# Patient Record
Sex: Male | Born: 1970
Health system: Southern US, Community
[De-identification: ages and names within clinical notes are randomized; demographics above are authoritative.]

## PROBLEM LIST (undated history)

## (undated) DIAGNOSIS — E785 Hyperlipidemia, unspecified: Secondary | ICD-10-CM

## (undated) DIAGNOSIS — E119 Type 2 diabetes mellitus without complications: Secondary | ICD-10-CM

## (undated) DIAGNOSIS — I10 Essential (primary) hypertension: Secondary | ICD-10-CM

## (undated) HISTORY — DX: Hyperlipidemia, unspecified: E78.5

## (undated) HISTORY — DX: Essential (primary) hypertension: I10

## (undated) HISTORY — DX: Type 2 diabetes mellitus without complications: E11.9

## (undated) HISTORY — PX: TONSILECTOMY/ADENOIDECTOMY WITH MYRINGOTOMY: SHX6125

---
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
## 1998-04-25 ENCOUNTER — Emergency Department (HOSPITAL_COMMUNITY): Admission: EM | Admit: 1998-04-25 | Discharge: 1998-04-25 | Payer: Self-pay | Admitting: Emergency Medicine

## 1998-06-23 ENCOUNTER — Emergency Department (HOSPITAL_COMMUNITY): Admission: EM | Admit: 1998-06-23 | Discharge: 1998-06-23 | Payer: Self-pay | Admitting: Emergency Medicine

## 2001-05-08 ENCOUNTER — Emergency Department (HOSPITAL_COMMUNITY): Admission: EM | Admit: 2001-05-08 | Discharge: 2001-05-08 | Payer: Self-pay | Admitting: Emergency Medicine

## 2001-06-08 ENCOUNTER — Emergency Department (HOSPITAL_COMMUNITY): Admission: EM | Admit: 2001-06-08 | Discharge: 2001-06-08 | Payer: Self-pay | Admitting: Emergency Medicine

## 2014-09-23 ENCOUNTER — Ambulatory Visit: Payer: Self-pay | Admitting: Internal Medicine

## 2014-09-23 ENCOUNTER — Encounter: Payer: Self-pay | Admitting: *Deleted

## 2014-11-26 ENCOUNTER — Emergency Department (INDEPENDENT_AMBULATORY_CARE_PROVIDER_SITE_OTHER)
Admission: EM | Admit: 2014-11-26 | Discharge: 2014-11-26 | Disposition: A | Discharge status: Home / Self Care | Payer: 59 | Source: Home / Self Care | Attending: Family Medicine | Admitting: Family Medicine

## 2014-11-26 ENCOUNTER — Encounter (HOSPITAL_COMMUNITY): Payer: Self-pay | Admitting: Emergency Medicine

## 2014-11-26 DIAGNOSIS — R69 Illness, unspecified: Secondary | ICD-10-CM

## 2014-11-26 DIAGNOSIS — J111 Influenza due to unidentified influenza virus with other respiratory manifestations: Secondary | ICD-10-CM

## 2014-11-26 DIAGNOSIS — A084 Viral intestinal infection, unspecified: Secondary | ICD-10-CM

## 2014-11-26 MED ORDER — PROMETHAZINE HCL 25 MG PO TABS: 25.0000 mg | ORAL_TABLET | Freq: Four times a day (QID) | ORAL | Status: DC | PRN

## 2014-11-26 NOTE — ED Provider Notes (Signed)
Howard Calhoun is a 44 y.o. male who presents to Urgent Care today for 4 days of body aches fever or headache abdominal pain nausea vomiting and diarrhea. This is also associated with decreased appetite. The abdominal pain resolved yesterday. Patient has mostly diarrhea and nausea with occasional vomiting now. He notes the headaches and body aches are improving. He has tried ibuprofen and NyQuil and DayQuil which seemed to help some. He denies any chest pains or significant difficulty with breathing. He feels well otherwise.   Past Medical History  Diagnosis Date  . Diabetes mellitus without complication   . Hypertension   . Hyperlipidemia    Past Surgical History  Procedure Laterality Date  . Appendectomy  1992  . Arthroscopic repair acl  1990  . Tonsilectomy/adenoidectomy with myringotomy     History  Substance Use Topics  . Smoking status: Never Smoker   . Smokeless tobacco: Not on file  . Alcohol Use: Yes     Comment: 1-2 a month   ROS as above Medications: No current facility-administered medications for this encounter.   Current Outpatient Prescriptions  Medication Sig Dispense Refill  . METFORMIN HCL ER PO Take 500 mg by mouth daily.    . Methylsulfonylmethane (MSM) 750 MG CAPS Take 2-3 capsules by mouth daily.    . promethazine (PHENERGAN) 25 MG tablet Take 1 tablet (25 mg total) by mouth every 6 (six) hours as needed for nausea or vomiting. 20 tablet 0   No Known Allergies   Exam:  BP 120/81 mmHg  Pulse 79  Temp(Src) 98.8 F (37.1 C) (Oral)  Resp 16  SpO2 97% Gen: Well NAD HEENT: EOMI,  MMM normal posterior pharynx and tympanic membranes Lungs: Normal work of breathing. CTABL Heart: RRR no MRG Abd: NABS, Soft. Nondistended, Nontender Exts: Brisk capillary refill, warm and well perfused.   No results found for this or any previous visit (from the past 24 hour(s)). No results found.  Assessment and Plan: 44 y.o. male with viral illness likely influenza  versus gastroenteritis. Patient is outside of treatment window for Tamiflu and afebrile. Treat with Phenergan for vomiting and continue Tylenol or NSAIDs for headache. Recommended over-the-counter Imodium for diarrhea.  Discussed warning signs or symptoms. Please see discharge instructions. Patient expresses understanding.     Howard BongEvan S Betzabe Bevans, MD 11/26/14 1723

## 2014-11-26 NOTE — Discharge Instructions (Signed)
Thank you for coming in today. Take Tylenol or ibuprofen or Aleve for pain and fevers and chills. Use Phenergan for nausea. Use over-the-counter Imodium as needed for diarrhea Call or go to the emergency room if you get worse, have trouble breathing, have chest pains, or palpitations.  If your belly pain worsens, or you have high fever, bad vomiting, blood in your stool or black tarry stool go to the Emergency Room.   Influenza Influenza ("the flu") is a viral infection of the respiratory tract. It occurs more often in winter months because people spend more time in close contact with one another. Influenza can make you feel very sick. Influenza easily spreads from person to person (contagious). CAUSES  Influenza is caused by a virus that infects the respiratory tract. You can catch the virus by breathing in droplets from an infected person's cough or sneeze. You can also catch the virus by touching something that was recently contaminated with the virus and then touching your mouth, nose, or eyes. RISKS AND COMPLICATIONS You may be at risk for a more severe case of influenza if you smoke cigarettes, have diabetes, have chronic heart disease (such as heart failure) or lung disease (such as asthma), or if you have a weakened immune system. Elderly people and pregnant women are also at risk for more serious infections. The most common problem of influenza is a lung infection (pneumonia). Sometimes, this problem can require emergency medical care and may be life threatening. SIGNS AND SYMPTOMS  Symptoms typically last 4 to 10 days and may include:  Fever.  Chills.  Headache, body aches, and muscle aches.  Sore throat.  Chest discomfort and cough.  Poor appetite.  Weakness or feeling tired.  Dizziness.  Nausea or vomiting. DIAGNOSIS  Diagnosis of influenza is often made based on your history and a physical exam. A nose or throat swab test can be done to confirm the diagnosis. TREATMENT   In mild cases, influenza goes away on its own. Treatment is directed at relieving symptoms. For more severe cases, your health care provider may prescribe antiviral medicines to shorten the sickness. Antibiotic medicines are not effective because the infection is caused by a virus, not by bacteria. HOME CARE INSTRUCTIONS  Take medicines only as directed by your health care provider.  Use a cool mist humidifier to make breathing easier.  Get plenty of rest until your temperature returns to normal. This usually takes 3 to 4 days.  Drink enough fluid to keep your urine clear or pale yellow.  Cover yourmouth and nosewhen coughing or sneezing,and wash your handswellto prevent thevirusfrom spreading.  Stay homefromwork orschool untilthe fever is gonefor at least 48full day. PREVENTION  An annual influenza vaccination (flu shot) is the best way to avoid getting influenza. An annual flu shot is now routinely recommended for all adults in the [place] SEEK MEDICAL CARE IF:  You experiencechest pain, yourcough worsens,or you producemore mucus.  Youhave nausea,vomiting, ordiarrhea.  Your fever returns or gets worse. SEEK IMMEDIATE MEDICAL CARE IF:  You havetrouble breathing, you become short of breath,or your skin ornails becomebluish.  You have severe painor stiffnessin the neck.  You develop a sudden headache, or pain in the face or ear.  You have nausea or vomiting that you cannot control. MAKE SURE YOU:   Understand these instructions.  Will watch your condition.  Will get help right away if you are not doing well or get worse. Document Released: 08/30/2000 Document Revised: 01/17/2014 Document Reviewed: 12/02/2011  ExitCare Patient Information 2015 [place], [place]. This information is not intended to replace advice given to you by your health care provider. Make sure you discuss any questions you have with your health care provider.   Viral  Gastroenteritis Viral gastroenteritis is also known as stomach flu. This condition affects the stomach and intestinal tract. It can cause sudden diarrhea and vomiting. The illness typically lasts 3 to 8 days. Most people develop an immune response that eventually gets rid of the virus. While this natural response develops, the virus can make you quite ill. CAUSES  Many different viruses can cause gastroenteritis, such as rotavirus or noroviruses. You can catch one of these viruses by consuming contaminated food or water. You may also catch a virus by sharing utensils or other personal items with an infected person or by touching a contaminated surface. SYMPTOMS  The most common symptoms are diarrhea and vomiting. These problems can cause a severe loss of body fluids (dehydration) and a body salt (electrolyte) imbalance. Other symptoms may include:  Fever.  Headache.  Fatigue.  Abdominal pain. DIAGNOSIS  Your caregiver can usually diagnose viral gastroenteritis based on your symptoms and a physical exam. A stool sample may also be taken to test for the presence of viruses or other infections. TREATMENT  This illness typically goes away on its own. Treatments are aimed at rehydration. The most serious cases of viral gastroenteritis involve vomiting so severely that you are not able to keep fluids down. In these cases, fluids must be given through an intravenous line (IV). HOME CARE INSTRUCTIONS   Drink enough fluids to keep your urine clear or pale yellow. Drink small amounts of fluids frequently and increase the amounts as tolerated.  Ask your caregiver for specific rehydration instructions.  Avoid:  Foods high in sugar.  Alcohol.  Carbonated drinks.  Tobacco.  Juice.  Caffeine drinks.  Extremely hot or cold fluids.  Fatty, greasy foods.  Too much intake of anything at one time.  Dairy products until 24 to 48 hours after diarrhea stops.  You may consume probiotics.  Probiotics are active cultures of beneficial bacteria. They may lessen the amount and number of diarrheal stools in adults. Probiotics can be found in yogurt with active cultures and in supplements.  Wash your hands well to avoid spreading the virus.  Only take over-the-counter or prescription medicines for pain, discomfort, or fever as directed by your caregiver. Do not give aspirin to children. Antidiarrheal medicines are not recommended.  Ask your caregiver if you should continue to take your regular prescribed and over-the-counter medicines.  Keep all follow-up appointments as directed by your caregiver. SEEK IMMEDIATE MEDICAL CARE IF:   You are unable to keep fluids down.  You do not urinate at least once every 6 to 8 hours.  You develop shortness of breath.  You notice blood in your stool or vomit. This may look like coffee grounds.  You have abdominal pain that increases or is concentrated in one small area (localized).  You have persistent vomiting or diarrhea.  You have a fever.  The patient is a child younger than 3 months, and he or she has a fever.  The patient is a child older than 3 months, and he or she has a fever and persistent symptoms.  The patient is a child older than 3 months, and he or she has a fever and symptoms suddenly get worse.  The patient is a baby, and he or she has no tears when  crying. MAKE SURE YOU:   Understand these instructions.  Will watch your condition.  Will get help right away if you are not doing well or get worse. Document Released: 09/02/2005 Document Revised: 11/25/2011 Document Reviewed: 06/19/2011 [facility] Patient Information 2015 [place], [place]. This information is not intended to replace advice given to you by your health care provider. Make sure you discuss any questions you have with your health care provider.

## 2014-11-26 NOTE — ED Notes (Signed)
C/o cold sx onset Tuesday Sx include HA, f/n/d, abd pain, BA, decreased appetite Alert, no signs of acute distress.

## 2015-12-12 ENCOUNTER — Telehealth: Payer: Self-pay | Admitting: General Practice

## 2015-12-12 NOTE — Telephone Encounter (Signed)
appt is set.  °

## 2015-12-12 NOTE — Telephone Encounter (Signed)
Pt will call back to schedule

## 2015-12-12 NOTE — Telephone Encounter (Signed)
yes

## 2015-12-12 NOTE — Telephone Encounter (Signed)
Pt is in need of a PCP and you were recommended Can you take him on?

## 2015-12-21 ENCOUNTER — Ambulatory Visit (INDEPENDENT_AMBULATORY_CARE_PROVIDER_SITE_OTHER): Payer: 59 | Admitting: Internal Medicine

## 2015-12-21 ENCOUNTER — Other Ambulatory Visit (INDEPENDENT_AMBULATORY_CARE_PROVIDER_SITE_OTHER): Payer: 59

## 2015-12-21 ENCOUNTER — Encounter: Payer: Self-pay | Admitting: Internal Medicine

## 2015-12-21 VITALS — BP 138/88 | HR 77 | Temp 98.4°F | Resp 16 | Ht 70.0 in | Wt 244.0 lb

## 2015-12-21 DIAGNOSIS — Z0000 Encounter for general adult medical examination without abnormal findings: Secondary | ICD-10-CM | POA: Insufficient documentation

## 2015-12-21 DIAGNOSIS — I10 Essential (primary) hypertension: Secondary | ICD-10-CM

## 2015-12-21 DIAGNOSIS — Z23 Encounter for immunization: Secondary | ICD-10-CM | POA: Diagnosis not present

## 2015-12-21 DIAGNOSIS — E785 Hyperlipidemia, unspecified: Secondary | ICD-10-CM | POA: Diagnosis not present

## 2015-12-21 DIAGNOSIS — E118 Type 2 diabetes mellitus with unspecified complications: Secondary | ICD-10-CM | POA: Diagnosis not present

## 2015-12-21 DIAGNOSIS — K2100 Gastro-esophageal reflux disease with esophagitis, without bleeding: Secondary | ICD-10-CM

## 2015-12-21 LAB — URINALYSIS, ROUTINE W REFLEX MICROSCOPIC
Bilirubin Urine: NEGATIVE
Hgb urine dipstick: NEGATIVE
KETONES UR: NEGATIVE
Leukocytes, UA: NEGATIVE
Nitrite: NEGATIVE
PH: 5 (ref 5.0–8.0)
Total Protein, Urine: NEGATIVE
UROBILINOGEN UA: 0.2 (ref 0.0–1.0)
Urine Glucose: >=1000 — AB

## 2015-12-21 LAB — LIPID PANEL
CHOL/HDL RATIO: 5
CHOLESTEROL: 229 mg/dL — AB (ref 0–200)
HDL: 48 mg/dL (ref >39.00)
LDL CALC: 148 mg/dL — AB (ref 0–99)
NonHDL: 181.24
TRIGLYCERIDES: 167 mg/dL — AB (ref 0.0–149.0)
VLDL: 33.4 mg/dL (ref 0.0–40.0)

## 2015-12-21 LAB — COMPREHENSIVE METABOLIC PANEL
ALK PHOS: 57 U/L (ref 39–117)
ALT: 20 U/L (ref 0–53)
AST: 17 U/L (ref 0–37)
Albumin: 4.2 g/dL (ref 3.5–5.2)
BILIRUBIN TOTAL: 0.4 mg/dL (ref 0.2–1.2)
BUN: 12 mg/dL (ref 6–23)
CO2: 30 meq/L (ref 19–32)
Calcium: 9.5 mg/dL (ref 8.4–10.5)
Chloride: 102 mEq/L (ref 96–112)
Creatinine, Ser: 0.96 mg/dL (ref 0.40–1.50)
GFR: 110.05 mL/min (ref >60.00)
GLUCOSE: 315 mg/dL — AB (ref 70–99)
POTASSIUM: 4 meq/L (ref 3.5–5.1)
SODIUM: 139 meq/L (ref 135–145)
TOTAL PROTEIN: 7.3 g/dL (ref 6.0–8.3)

## 2015-12-21 LAB — CBC WITH DIFFERENTIAL/PLATELET
Basophils Absolute: 0 10*3/uL (ref 0.0–0.1)
Basophils Relative: 0.4 % (ref 0.0–3.0)
EOS PCT: 0.3 % (ref 0.0–5.0)
Eosinophils Absolute: 0 10*3/uL (ref 0.0–0.7)
HCT: 40.8 % (ref 39.0–52.0)
Hemoglobin: 13.6 g/dL (ref 13.0–17.0)
LYMPHS ABS: 2.5 10*3/uL (ref 0.7–4.0)
Lymphocytes Relative: 30.9 % (ref 12.0–46.0)
MCHC: 33.4 g/dL (ref 30.0–36.0)
MCV: 84.8 fl (ref 78.0–100.0)
MONO ABS: 0.4 10*3/uL (ref 0.1–1.0)
Monocytes Relative: 5 % (ref 3.0–12.0)
NEUTROS ABS: 5.1 10*3/uL (ref 1.4–7.7)
NEUTROS PCT: 63.4 % (ref 43.0–77.0)
PLATELETS: 347 10*3/uL (ref 150.0–400.0)
RBC: 4.81 Mil/uL (ref 4.22–5.81)
RDW: 13.6 % (ref 11.5–15.5)
WBC: 8 10*3/uL (ref 4.0–10.5)

## 2015-12-21 LAB — MICROALBUMIN / CREATININE URINE RATIO
CREATININE, U: 184.3 mg/dL
MICROALB UR: 2.6 mg/dL — AB (ref 0.0–1.9)
MICROALB/CREAT RATIO: 1.4 mg/g (ref 0.0–30.0)

## 2015-12-21 LAB — FECAL OCCULT BLOOD, GUAIAC: Fecal Occult Blood: NEGATIVE

## 2015-12-21 LAB — HEMOGLOBIN A1C: Hgb A1c MFr Bld: 13.2 % — ABNORMAL HIGH (ref 4.6–6.5)

## 2015-12-21 LAB — PSA: PSA: 0.4 ng/mL (ref 0.10–4.00)

## 2015-12-21 LAB — POCT GLUCOSE (DEVICE FOR HOME USE): POC Glucose: 326 mg/dl — AB (ref 70–99)

## 2015-12-21 MED ORDER — ONETOUCH VERIO FLEX SYSTEM W/DEVICE KIT: 1.0000 | PACK | Freq: Three times a day (TID) | Status: DC

## 2015-12-21 MED ORDER — INSULIN DEGLUDEC 100 UNIT/ML ~~LOC~~ SOPN: 40.0000 [IU] | PEN_INJECTOR | Freq: Every day | SUBCUTANEOUS | Status: DC

## 2015-12-21 MED ORDER — METFORMIN HCL ER (MOD) 1000 MG PO TB24: 1000.0000 mg | ORAL_TABLET | Freq: Two times a day (BID) | ORAL | Status: DC

## 2015-12-21 MED ORDER — LINAGLIPTIN 5 MG PO TABS: 5.0000 mg | ORAL_TABLET | Freq: Every day | ORAL | Status: DC

## 2015-12-21 MED ORDER — GLUCOSE BLOOD VI STRP: ORAL_STRIP | Status: DC

## 2015-12-21 NOTE — Progress Notes (Signed)
Pre visit review using our clinic review tool, if applicable. No additional management support is needed unless otherwise documented below in the visit note. 

## 2015-12-21 NOTE — Patient Instructions (Signed)

## 2015-12-21 NOTE — Progress Notes (Signed)
Subjective:  Patient ID: Howard Calhoun, male    DOB: Nov 15, 1970  Age: 45 y.o. MRN: [id number]  CC: Annual Exam; Gastroesophageal Reflux; Cough; Hyperlipidemia; Hypertension; and Diabetes  NEW TO ME  HPI Howard Calhoun presents for a CPX.  He has had type 2 diabetes mellitus for about 10 years. He has not recently been treating it. He complains of polyuria, polydipsia, and polyphagia. He has not recently been checking his blood sugars.  He also complains of a chronic cough for 6 years. He tells me that he had a thorough evaluation of this with his prior physician at [facility]. He was told that he does not have asthma or lung disease but the cough may be related to acid reflux disease. He does not experience heartburn, odynophagia, dysphagia, weight loss, or chest pain.  History Jamare has a past medical history of Diabetes mellitus without complication ([place]); Hypertension; and Hyperlipidemia.   He has past surgical history that includes Appendectomy (1992); Arthroscopic repair ACL (1990); and Tonsilectomy/adenoidectomy with myringotomy.   His family history includes Diabetes in his father; Heart disease in his father; Hodgkin's lymphoma in his father; Hypertension in his mother; Kidney disease in his father. There is no history of Cancer, Early death, or Stroke.He reports that he has never smoked. He does not have any smokeless tobacco history on file. He reports that he does not drink alcohol or use illicit drugs.  Outpatient Prescriptions Prior to Visit  Medication Sig Dispense Refill  . Methylsulfonylmethane (MSM) 750 MG CAPS Take 2-3 capsules by mouth daily.    Marland Kitchen METFORMIN HCL ER PO Take 500 mg by mouth daily.    . promethazine (PHENERGAN) 25 MG tablet Take 1 tablet (25 mg total) by mouth every 6 (six) hours as needed for nausea or vomiting. 20 tablet 0   No facility-administered medications prior to visit.    ROS Review of Systems  Constitutional: Negative.  Negative  for fever, chills, diaphoresis, appetite change and fatigue.  HENT: Negative.   Eyes: Negative.   Respiratory: Positive for cough. Negative for apnea, choking, chest tightness, shortness of breath, wheezing and stridor.   Cardiovascular: Negative.  Negative for chest pain, palpitations and leg swelling.  Gastrointestinal: Negative.  Negative for nausea, vomiting, abdominal pain, diarrhea, constipation and blood in stool.  Endocrine: Positive for polydipsia, polyphagia and polyuria. Negative for cold intolerance and heat intolerance.  Genitourinary: Negative.   Musculoskeletal: Negative.  Negative for myalgias, back pain, joint swelling, arthralgias and neck pain.  Skin: Negative.  Negative for rash.  Allergic/Immunologic: Negative.   Neurological: Negative.  Negative for dizziness, weakness, light-headedness and numbness.  Hematological: Negative.  Does not bruise/bleed easily.  Psychiatric/Behavioral: Negative.     Objective:  BP 138/88 mmHg  Pulse 77  Temp(Src) 98.4 F (36.9 C) (Oral)  Resp 16  Ht '5\' 10"'$  (1.778 m)  Wt 244 lb (110.678 kg)  BMI 35.01 kg/m2  SpO2 97%  Physical Exam  Constitutional: He is oriented to person, place, and time. He appears well-developed and well-nourished.  HENT:  Head: Normocephalic and atraumatic.  Mouth/Throat: Oropharynx is clear and moist. No oropharyngeal exudate.  Eyes: Conjunctivae are normal. Right eye exhibits no discharge. Left eye exhibits no discharge. No scleral icterus.  Neck: Normal range of motion. Neck supple. No JVD present. No tracheal deviation present. No thyromegaly present.  Cardiovascular: Normal rate, regular rhythm, normal heart sounds and intact distal pulses.  Exam reveals no gallop and no friction rub.   No  murmur heard. EKG ----  Sinus  Rhythm  WITHIN NORMAL LIMITS  Pulmonary/Chest: Effort normal. No stridor. No respiratory distress. He has no decreased breath sounds. He has no wheezes. He has rhonchi in the right  lower field and the left lower field. He has no rales. He exhibits no tenderness.  Abdominal: Soft. Bowel sounds are normal. He exhibits no distension and no mass. There is no tenderness. There is no rebound and no guarding. Hernia confirmed negative in the right inguinal area and confirmed negative in the left inguinal area.  Genitourinary: Rectum normal, prostate normal, testes normal and penis normal. Rectal exam shows no external hemorrhoid, no internal hemorrhoid, no fissure, no mass, no tenderness and anal tone normal. Guaiac negative stool. Prostate is not enlarged and not tender. Right testis shows no mass, no swelling and no tenderness. Right testis is descended. Left testis shows no mass, no swelling and no tenderness. Left testis is descended. Circumcised. No penile erythema or penile tenderness. No discharge found.  There is a spermatocele on the right side but no testicular masses  Musculoskeletal: Normal range of motion. He exhibits no edema or tenderness.  Lymphadenopathy:    He has no cervical adenopathy.       Right: No inguinal adenopathy present.       Left: No inguinal adenopathy present.  Neurological: He is oriented to person, place, and time.  Skin: Skin is warm and dry. No rash noted. He is not diaphoretic. No erythema. No pallor.  Psychiatric: He has a normal mood and affect. His behavior is normal. Judgment and thought content normal.  Vitals reviewed.  Lab Results  Component Value Date   WBC 8.0 12/21/2015   HGB 13.6 12/21/2015   HCT 40.8 12/21/2015   PLT 347.0 12/21/2015   GLUCOSE 315* 12/21/2015   CHOL 229* 12/21/2015   TRIG 167.0* 12/21/2015   HDL 48.00 12/21/2015   LDLCALC 148* 12/21/2015   ALT 20 12/21/2015   AST 17 12/21/2015   NA 139 12/21/2015   K 4.0 12/21/2015   CL 102 12/21/2015   CREATININE 0.96 12/21/2015   BUN 12 12/21/2015   CO2 30 12/21/2015   PSA 0.40 12/21/2015   HGBA1C 13.2* 12/21/2015   MICROALBUR 2.6* 12/21/2015     Assessment &  Plan:   Marcelus was seen today for annual exam, gastroesophageal reflux, cough, hyperlipidemia, hypertension and diabetes.  Diagnoses and all orders for this visit:  Essential hypertension- his BP is well controlled, lytes and renal function are stable -     EKG 12-Lead  Routine general medical examination at a health care facility- exam completed, labs ordered and reviewed, vaccines were reviewed and updated, pt ed material was given -     Lipid panel; Future -     Comprehensive metabolic panel; Future -     CBC with Differential/Platelet; Future -     PSA; Future -     Urinalysis, Routine w reflex microscopic (not at [facility]); Future -     POCT Glucose (Device for Home Use)  Type 2 diabetes mellitus with complication, without long-term current use of insulin ([place])- his A1c is up to 13.2% and his C-peptide is relatively low, it appears that he has insulin-dependent type 2 diabetes mellitus, will start treating him with basal insulin and will start high-dose metformin and Tradjenta for blood sugar control. He was referred for an annual exam. -     Hemoglobin A1c; Future -     C-peptide; Future -  Microalbumin / creatinine urine ratio; Future -     Ambulatory referral to Ophthalmology -     Insulin Degludec (TRESIBA FLEXTOUCH) 100 UNIT/ML SOPN; Inject 40 Units into the skin daily. -     Amb Referral to Nutrition and Diabetic E -     linagliptin (TRADJENTA) 5 MG TABS tablet; Take 1 tablet (5 mg total) by mouth daily. -     metFORMIN (GLUMETZA) 1000 MG (MOD) 24 hr tablet; Take 1 tablet (1,000 mg total) by mouth 2 (two) times daily with a meal. -     Blood Glucose Monitoring Suppl ([place]) w/Device KIT; 1 Act by Does not apply route 3 (three) times daily with meals. -     glucose blood (ONETOUCH VERIO) test strip; Use TID  Hyperlipidemia with target LDL less than 100- he has not achieved his LDL goal so I've asked him to start a statin.  Gastroesophageal reflux disease  with esophagitis- he is not willing to treat this at this time.  Need for 23-polyvalent pneumococcal polysaccharide vaccine -     Pneumococcal polysaccharide vaccine 23-valent greater than or equal to 2yo subcutaneous/IM  Need for Tdap vaccination -     Tdap vaccine greater than or equal to 7yo IM   I have discontinued Mr. Gadson METFORMIN HCL ER PO, promethazine, and metFORMIN. I am also having him start on Insulin Degludec, linagliptin, metFORMIN, ONETOUCH VERIO FLEX SYSTEM, and glucose blood. Additionally, I am having him maintain his MSM and olmesartan.  Meds ordered this encounter  Medications  . DISCONTD: metFORMIN (GLUCOPHAGE-XR) 500 MG 24 hr tablet    Sig:     Refill:  0  . olmesartan (BENICAR) 40 MG tablet    Sig: Take 40 mg by mouth daily.    Refill:  0  . Insulin Degludec (TRESIBA FLEXTOUCH) 100 UNIT/ML SOPN    Sig: Inject 40 Units into the skin daily.    Dispense:  3 mL    Refill:  11  . linagliptin (TRADJENTA) 5 MG TABS tablet    Sig: Take 1 tablet (5 mg total) by mouth daily.    Dispense:  90 tablet    Refill:  3  . metFORMIN (GLUMETZA) 1000 MG (MOD) 24 hr tablet    Sig: Take 1 tablet (1,000 mg total) by mouth 2 (two) times daily with a meal.    Dispense:  180 tablet    Refill:  1  . Blood Glucose Monitoring Suppl (ONETOUCH VERIO FLEX SYSTEM) w/Device KIT    Sig: 1 Act by Does not apply route 3 (three) times daily with meals.    Dispense:  2 kit    Refill:  0  . glucose blood (ONETOUCH VERIO) test strip    Sig: Use TID    Dispense:  100 each    Refill:  12     Follow-up: Return in about 2 months (around 02/20/2016).  Scarlette Calico, MD

## 2015-12-22 LAB — C-PEPTIDE: C-Peptide: 2.74 ng/mL (ref 0.80–3.85)

## 2015-12-24 ENCOUNTER — Encounter: Payer: Self-pay | Admitting: Internal Medicine

## 2015-12-25 MED ORDER — ATORVASTATIN CALCIUM 40 MG PO TABS: 40.0000 mg | ORAL_TABLET | Freq: Every day | ORAL | Status: DC

## 2016-01-05 ENCOUNTER — Telehealth: Payer: Self-pay | Admitting: Internal Medicine

## 2016-01-05 DIAGNOSIS — E118 Type 2 diabetes mellitus with unspecified complications: Secondary | ICD-10-CM

## 2016-01-05 NOTE — Telephone Encounter (Signed)
Patient states that he was told to call back with what insulin insurance will cover.  States insurance is does not cover tresiba flextouch.  States they will cover levimer w/ PA.  Patient states he has one pin left.  He might have enough to get him through to Tuesday.  Is there samples we can get him.  Also states insurance goes through [facility] but patient could use local pharmacy if needed.

## 2016-01-05 NOTE — Telephone Encounter (Signed)
Tried to call patient back, no way to leave message, mailbox is full---routing to dr jones----i looked for samples and could not find any for levemir----please advise, thanks

## 2016-01-06 MED ORDER — INSULIN DETEMIR 100 UNIT/ML FLEXPEN: 40.0000 [IU] | PEN_INJECTOR | Freq: Every day | SUBCUTANEOUS | Status: DC

## 2016-01-06 NOTE — Telephone Encounter (Signed)
done

## 2016-01-08 NOTE — Telephone Encounter (Signed)
PA initiated via CoverMyMeds key [id number]

## 2016-01-08 NOTE — Telephone Encounter (Signed)
Received a message from CoverMyMeds regarding this PA - "This medication is on your plan's list of covered drugs. Prior authorization is not required at this time"  Pt advised in detail via personal VM

## 2016-01-08 NOTE — Telephone Encounter (Signed)
Routing to dahlia---i can't reach patient, mailbox full---can we start PA process?  thanks

## 2016-01-10 ENCOUNTER — Telehealth: Payer: Self-pay

## 2016-01-10 MED ORDER — INSULIN PEN NEEDLE 32G X 4 MM MISC: Status: DC

## 2016-01-10 NOTE — Telephone Encounter (Signed)
done

## 2016-01-10 NOTE — Telephone Encounter (Signed)
Send the pin needles. Thanks.

## 2016-01-12 NOTE — Telephone Encounter (Signed)
error 

## 2016-01-25 ENCOUNTER — Encounter: Payer: Self-pay | Admitting: *Deleted

## 2016-01-25 ENCOUNTER — Encounter: Payer: 59 | Attending: Internal Medicine | Admitting: *Deleted

## 2016-01-25 ENCOUNTER — Ambulatory Visit: Payer: 59 | Admitting: *Deleted

## 2016-01-25 DIAGNOSIS — E119 Type 2 diabetes mellitus without complications: Secondary | ICD-10-CM | POA: Insufficient documentation

## 2016-01-25 NOTE — Progress Notes (Signed)
Diabetes Self-Management Education  Visit Type: First/Initial  Appt. Start Time: 0915 Appt. End Time: 1045  01/25/2016  [name], identified by name and date of birth, is a 45 y.o. male with a diagnosis of Diabetes: Type 2.   ASSESSMENT       Diabetes Self-Management Education - 01/25/16 0920    Visit Information   Visit Type First/Initial   Initial Visit   Diabetes Type Type 2   Are you currently following a meal plan? No   Are you taking your medications as prescribed? Yes  sometimes forgets   Date Diagnosed 2013   Health Coping   How would you rate your overall health? Poor   Psychosocial Assessment   Patient Belief/Attitude about Diabetes Motivated to manage diabetes   Self-care barriers None   Self-management support Internet communities;Doctor's office   Other persons present Patient   Patient Concerns Nutrition/Meal planning;Medication;Weight Control   Special Needs None   Preferred Learning Style Hands on;Visual   Learning Readiness Ready   How often do you need to have someone help you when you read instructions, pamphlets, or other written materials from your doctor or pharmacy? 1 - Never   What is the last grade level you completed in school? unior in college   Pre-Education Assessment   Patient understands the diabetes disease and treatment process. Needs Review   Patient understands incorporating nutritional management into lifestyle. Needs Review   Patient undertands incorporating physical activity into lifestyle. Needs Instruction   Patient understands using medications safely. Needs Review   Patient understands monitoring blood glucose, interpreting and using results Needs Instruction   Patient understands prevention, detection, and treatment of acute complications. Needs Instruction   Patient understands prevention, detection, and treatment of chronic complications. Needs Review   Patient understands how to develop strategies to address  psychosocial issues. Needs Instruction   Patient understands how to develop strategies to promote health/change behavior. Needs Instruction   Complications   Last HgB A1C per patient/outside source 13.2 %   How often do you check your blood sugar? 3-4 times/day   Fasting Blood glucose range (mg/dL) 604-540   Postprandial Blood glucose range (mg/dL) 981-191;478-295   Number of hypoglycemic episodes per month 0  can't tell when he's having low glucose   Number of hyperglycemic episodes per week 0   Have you had a dilated eye exam in the past 12 months? Yes   Have you had a dental exam in the past 12 months? Yes   Are you checking your feet? No   Dietary Intake   Breakfast cereal (cheerios or raisin bran) with 2% milk   Lunch skipped   Snack (afternoon) wendy 4 for $4   Dinner wheat spaghetti   Beverage(s) light lemonade, water   Exercise   Exercise Type ADL's   Patient Education   Previous Diabetes Education Yes (please comment)   Disease state  Definition of diabetes, type 1 and 2, and the diagnosis of diabetes;Factors that contribute to the development of diabetes;Explored patient's options for treatment of their diabetes   Nutrition management  Role of diet in the treatment of diabetes and the relationship between the three main macronutrients and blood glucose level;Food label reading, portion sizes and measuring food.;Carbohydrate counting;Information on hints to eating out and maintain blood glucose control.   Physical activity and exercise  Role of exercise on diabetes management, blood pressure control and cardiac health.   Medications Taught/reviewed insulin injection, site rotation, insulin storage and needle  disposal.;Other (comment)  timing of medications   Monitoring Taught/evaluated SMBG meter.   Acute complications Taught treatment of hypoglycemia - the 15 rule.;Discussed and identified patients' treatment of hyperglycemia.   Chronic complications Relationship between  chronic complications and blood glucose control   Psychosocial adjustment Role of stress on diabetes   Personal strategies to promote health Lifestyle issues that need to be addressed for better diabetes care   Individualized Goals (developed by patient)   Nutrition Follow meal plan discussed   Medications take my medication as prescribed   Monitoring  test my blood glucose as discussed   Reducing Risk examine blood glucose patterns   Outcomes   Expected Outcomes Demonstrated interest in learning. Expect positive outcomes   Future DMSE PRN   Program Status Completed      Individualized Plan for Diabetes Self-Management Training:   Learning Objective:  Patient will have a greater understanding of diabetes self-management. Patient education plan is to attend individual and/or group sessions per assessed needs and concerns.   Plan:   Patient Instructions  Goals:  Follow Diabetes Meal Plan as instructed  Eat 3 meals and 2 snacks, every 3-5 hrs  Limit carbohydrate intake to 60 grams carbohydrate/meal  Limit carbohydrate intake to 30 grams carbohydrate/snack  Add lean protein foods to meals/snacks  Monitor glucose levels as instructed by your doctor  Aim for 30 mins of physical activity daily  Bring food record and glucose log to your next nutrition visit     Expected Outcomes:  Demonstrated interest in learning. Expect positive outcomes  Education material provided: Living Well with Diabetes and Meal plan card  If problems or questions, patient to contact team via:  Phone  Future DSME appointment: PRN

## 2016-01-25 NOTE — Patient Instructions (Signed)
Goals:  Follow Diabetes Meal Plan as instructed  Eat 3 meals and 2 snacks, every 3-5 hrs  Limit carbohydrate intake to 60 grams carbohydrate/meal  Limit carbohydrate intake to 30 grams carbohydrate/snack  Add lean protein foods to meals/snacks  Monitor glucose levels as instructed by your doctor  Aim for 30 mins of physical activity daily  Bring food record and glucose log to your next nutrition visit

## 2016-02-20 ENCOUNTER — Ambulatory Visit: Payer: 59 | Admitting: Internal Medicine

## 2016-02-20 DIAGNOSIS — Z0289 Encounter for other administrative examinations: Secondary | ICD-10-CM

## 2017-06-25 ENCOUNTER — Encounter (HOSPITAL_COMMUNITY): Payer: Self-pay | Admitting: Family Medicine

## 2017-06-25 ENCOUNTER — Ambulatory Visit (HOSPITAL_COMMUNITY)
Admission: EM | Admit: 2017-06-25 | Discharge: 2017-06-25 | Disposition: A | Discharge status: Home / Self Care | Payer: 59 | Attending: Family Medicine | Admitting: Family Medicine

## 2017-06-25 DIAGNOSIS — N4889 Other specified disorders of penis: Secondary | ICD-10-CM | POA: Diagnosis not present

## 2017-06-25 NOTE — ED Triage Notes (Signed)
Pt here for sores around penis. sts tender to touch and he has been treating with OTC fungal meds which help at times.

## 2017-06-27 NOTE — ED Provider Notes (Signed)
  [facility]   [id number] 06/25/17 Arrival Time: 1426  ASSESSMENT & PLAN:  1. Penile irritation    Question if fungal related given his description. Looks normal today. Watch. May try OTC clotrimazole if he desires.  Reviewed expectations re: course of current medical issues. Questions answered. Outlined signs and symptoms indicating need for more acute intervention. Patient verbalized understanding. After Visit Summary given.   SUBJECTIVE:  COLLINS KERBY is a 46 y.o. male who presents with complaint of occasionally seeing "small red bumps" on penis. "Sometimes moist and irritated." Currently without symptoms.Has used antifungal topical cream and questions if it helped. No penile discharge or wounds/lesions.  ROS: As per HPI.  OBJECTIVE: Vitals:   06/25/17 1446  BP: (!) 152/111  Pulse: 84  Resp: 18  Temp: 98.2 F (36.8 C)  SpO2: 98%    General appearance: alert; no distress Extremities: no edema Skin: normal penile exam Psychological: alert and cooperative; normal mood and affect  No Known Allergies  Past Medical History:  Diagnosis Date  . Diabetes mellitus without complication (HCC)   . Hyperlipidemia   . Hypertension    Social History   Social History  . Marital status: Married    Spouse name: N/A  . Number of children: N/A  . Years of education: N/A   Occupational History  . Not on file.   Social History Main Topics  . Smoking status: Never Smoker  . Smokeless tobacco: Not on file  . Alcohol use No     Comment: 1-2 a month  . Drug use: No  . Sexual activity: Yes   Other Topics Concern  . Not on file   Social History Narrative  . No narrative on file   Family History  Problem Relation Age of Onset  . Hypertension Mother   . Diabetes Father   . Heart disease Father   . Kidney disease Father   . Hodgkin's lymphoma Father        remission -2 years  . Cancer Neg Hx   . Early death Neg Hx   . Stroke Neg Hx    Past Surgical  History:  Procedure Laterality Date  . APPENDECTOMY  1992  . ARTHROSCOPIC REPAIR ACL  1990  . TONSILECTOMY/ADENOIDECTOMY WITH MYRINGOTOMY       Mardella Layman, MD 06/27/17 [id number]

## 2017-07-25 ENCOUNTER — Telehealth: Payer: Self-pay | Admitting: Medical Oncology

## 2017-07-25 NOTE — Telephone Encounter (Signed)
err

## 2017-08-05 ENCOUNTER — Encounter (HOSPITAL_COMMUNITY): Payer: Self-pay | Admitting: Emergency Medicine

## 2017-08-05 ENCOUNTER — Ambulatory Visit (HOSPITAL_COMMUNITY)
Admission: EM | Admit: 2017-08-05 | Discharge: 2017-08-05 | Disposition: A | Discharge status: Home / Self Care | Payer: 59 | Attending: Family Medicine | Admitting: Family Medicine

## 2017-08-05 DIAGNOSIS — M79672 Pain in left foot: Secondary | ICD-10-CM | POA: Diagnosis not present

## 2017-08-05 DIAGNOSIS — I10 Essential (primary) hypertension: Secondary | ICD-10-CM

## 2017-08-05 DIAGNOSIS — E118 Type 2 diabetes mellitus with unspecified complications: Secondary | ICD-10-CM | POA: Diagnosis not present

## 2017-08-05 LAB — GLUCOSE, CAPILLARY: Glucose-Capillary: 283 mg/dL — ABNORMAL HIGH (ref 65–99)

## 2017-08-05 MED ORDER — OLMESARTAN MEDOXOMIL 40 MG PO TABS: 40.0000 mg | ORAL_TABLET | Freq: Every day | ORAL | 0 refills | Status: DC

## 2017-08-05 MED ORDER — METFORMIN HCL 500 MG PO TABS: 500.0000 mg | ORAL_TABLET | Freq: Two times a day (BID) | ORAL | 0 refills | Status: DC

## 2017-08-05 MED ORDER — NAPROXEN 375 MG PO TABS: 375.0000 mg | ORAL_TABLET | Freq: Two times a day (BID) | ORAL | 0 refills | Status: DC

## 2017-08-05 NOTE — Discharge Instructions (Signed)
We will restart metformin at a lower dose today to try to minimize your GI side effects. Please start this. I have restarted your blood pressure medication as well as your BP is elevated today. Naproxen twice a day with food for your foot pain, try to elevate it and apply ice as well. Please call to make an appointment with your primary care provider as soon as possible for recheck and for annual physical.

## 2017-08-05 NOTE — ED Triage Notes (Signed)
Pt here for left foot pain without obvious injury; pt is non compliant with htn and DM meds currently

## 2017-08-05 NOTE — ED Provider Notes (Signed)
[name]    CSN: [id number] Arrival date & time: 08/05/17  1258     History   Chief Complaint Chief Complaint  Patient presents with  . Foot Pain    HPI Howard Calhoun is a 46 y.o. male.   Howard Calhoun presents with complaints of dorsal foot pain which has been present for the past 3 days. Worse with dependency of foot and with ankle flexion and extension. No known injury. States he intermittently has bilateral foot pains which come and go but this has been more persistent. No known trauma or injury to the foot. Without swelling or warmth. Has not taken any medications for his symptoms. Rates pain 4/10 now. At its worst it reaches 7/10.   Also states he has not checked his blood sugar in 3 weeks, has not taken any of his medications including antihypertensives, metformin or insulin in months. He has not seen his PCP in 1 year. hgb a1c was last 13. Denies headache, vision changes or chest pain.   ROS per HPI.       Past Medical History:  Diagnosis Date  . Diabetes mellitus without complication ([place])   . Hyperlipidemia   . Hypertension     Patient Active Problem List   Diagnosis Date Noted  . Essential hypertension 12/21/2015  . Routine general medical examination at a health care facility 12/21/2015  . Type 2 diabetes mellitus with complication, without long-term current use of insulin ([place]) 12/21/2015  . Hyperlipidemia with target LDL less than 100 12/21/2015  . Gastroesophageal reflux disease with esophagitis 12/21/2015    Past Surgical History:  Procedure Laterality Date  . APPENDECTOMY  1992  . ARTHROSCOPIC REPAIR ACL  1990  . TONSILECTOMY/ADENOIDECTOMY WITH MYRINGOTOMY         Home Medications    Prior to Admission medications   Medication Sig Start Date End Date Taking? Authorizing Provider  atorvastatin (LIPITOR) 40 MG tablet Take 1 tablet (40 mg total) by mouth daily. 12/25/15   Janith Lima, MD  Blood Glucose Monitoring Suppl ([facility]) w/Device KIT 1 Act by Does not apply route 3 (three) times daily with meals. 12/21/15   Janith Lima, MD  glucose blood [facility] VERIO) test strip Use TID 12/21/15   Janith Lima, MD  Insulin Detemir (LEVEMIR FLEXTOUCH) 100 UNIT/ML Pen Inject 40 Units into the skin daily at 10 pm. 01/06/16   Janith Lima, MD  Insulin Pen Needle 32G X 4 MM MISC Use to inject insulin DX E11.8 01/10/16   Janith Lima, MD  linagliptin (TRADJENTA) 5 MG TABS tablet Take 1 tablet (5 mg total) by mouth daily. 12/21/15   Janith Lima, MD  metFORMIN (GLUCOPHAGE) 500 MG tablet Take 1 tablet (500 mg total) by mouth 2 (two) times daily with a meal. 08/05/17   Shakeeta Godette, Malachy Moan, NP  Methylsulfonylmethane (MSM) 750 MG CAPS Take 2-3 capsules by mouth daily.    [provider]  naproxen (NAPROSYN) 375 MG tablet Take 1 tablet (375 mg total) by mouth 2 (two) times daily. 08/05/17   Zigmund Gottron, NP  olmesartan (BENICAR) 40 MG tablet Take 1 tablet (40 mg total) by mouth daily. 08/05/17   Zigmund Gottron, NP    Family History Family History  Problem Relation Age of Onset  . Hypertension Mother   . Diabetes Father   . Heart disease Father   . Kidney disease Father   . Hodgkin's lymphoma Father  remission -2 years  . Cancer Neg Hx   . Early death Neg Hx   . Stroke Neg Hx     Social History Social History   Tobacco Use  . Smoking status: Never Smoker  Substance Use Topics  . Alcohol use: No    Comment: 1-2 a month  . Drug use: No     Allergies   Patient has no known allergies.   Review of Systems Review of Systems   Physical Exam Triage Vital Signs ED Triage Vitals [08/05/17 1343]  Enc Vitals Group     BP (!) 198/105     Pulse Rate 87     Resp 18     Temp 98.5 F (36.9 C)     Temp Source Oral     SpO2 99 %     Weight      Height      Head Circumference      Peak Flow      Pain Score 6     Pain Loc      Pain Edu?      Excl. in [place]?    No data  found.  Updated Vital Signs BP (!) 198/105 (BP Location: Left Arm)   Pulse 87   Temp 98.5 F (36.9 C) (Oral)   Resp 18   SpO2 99%   Visual Acuity Right Eye Distance:   Left Eye Distance:   Bilateral Distance:    Right Eye Near:   Left Eye Near:    Bilateral Near:     Physical Exam  Constitutional: He is oriented to person, place, and time. He appears well-developed and well-nourished.  Cardiovascular: Normal rate and regular rhythm.  Pulmonary/Chest: Effort normal and breath sounds normal.  Musculoskeletal:       Left foot: There is tenderness and bony tenderness. There is normal range of motion, no swelling, normal capillary refill, no crepitus, no deformity and no laceration.       Feet:  Tenderness on palpation; strong pedal pulse, full sensation to foot and toes; complete toe and ankle ROM; without redness, swelling or bruising present; cap refill <2 sec  Neurological: He is alert and oriented to person, place, and time.  Skin: Skin is warm and dry.  Vitals reviewed.    UC Treatments / Results  Labs (all labs ordered are listed, but only abnormal results are displayed) Labs Reviewed  GLUCOSE, CAPILLARY - Abnormal; Notable for the following components:      Result Value   Glucose-Capillary 283 (*)    All other components within normal limits    EKG  EKG Interpretation None       Radiology No results found.  Procedures Procedures (including critical care time)  Medications Ordered in UC Medications - No data to display   Initial Impression / Assessment and Plan / UC Course  I have reviewed the triage vital signs and the nursing notes.  Pertinent labs & imaging results that were available during my care of the patient were reviewed by me and considered in my medical decision making (see chart for details).     atraumatic left foot pain, has not tried any treatments for this. Discussed sprain, vs arthritis vs stress fracture as differentials.  Deferred imaging today, offered to patient and he declines after discussion. Will try ice, elevation and naproxen BID. Discussed at length with patient the importance of following up after today with his PCP. Noted elevated BP, restarted his BP medications. Restarted metformin, he  states he has insulin at home. Provided education of risks of not treating these chronic conditions. Patient agreeable to making an appointment for follow up and annual physical. If symptoms worsen or do not improve in the next week to return to be seen or to follow up with PCP. Patient verbalized understanding and agreeable to plan. Ambulatory out of clinic without difficulty.    Final Clinical Impressions(s) / UC Diagnoses   Final diagnoses:  Foot pain, left  Hypertension, unspecified type  Type 2 diabetes mellitus with complication, unspecified whether long term insulin use [place])    ED Discharge Orders        Ordered    olmesartan (BENICAR) 40 MG tablet  Daily     08/05/17 1403    metFORMIN (GLUCOPHAGE) 500 MG tablet  2 times daily with meals     08/05/17 1403    naproxen (NAPROSYN) 375 MG tablet  2 times daily     08/05/17 1403       Controlled Substance Prescriptions [place] Controlled Substance Registry consulted? Not Applicable   Zigmund Gottron, NP 08/05/17 1413

## 2018-04-30 ENCOUNTER — Other Ambulatory Visit (INDEPENDENT_AMBULATORY_CARE_PROVIDER_SITE_OTHER): Payer: 59

## 2018-04-30 ENCOUNTER — Encounter: Payer: Self-pay | Admitting: Internal Medicine

## 2018-04-30 ENCOUNTER — Ambulatory Visit (INDEPENDENT_AMBULATORY_CARE_PROVIDER_SITE_OTHER): Payer: 59 | Admitting: Internal Medicine

## 2018-04-30 VITALS — BP 164/114 | HR 83 | Temp 98.2°F | Resp 16 | Ht 70.0 in | Wt 240.0 lb

## 2018-04-30 DIAGNOSIS — K2100 Gastro-esophageal reflux disease with esophagitis, without bleeding: Secondary | ICD-10-CM

## 2018-04-30 DIAGNOSIS — R369 Urethral discharge, unspecified: Secondary | ICD-10-CM

## 2018-04-30 DIAGNOSIS — R1310 Dysphagia, unspecified: Secondary | ICD-10-CM

## 2018-04-30 DIAGNOSIS — E559 Vitamin D deficiency, unspecified: Secondary | ICD-10-CM | POA: Insufficient documentation

## 2018-04-30 DIAGNOSIS — R1319 Other dysphagia: Secondary | ICD-10-CM

## 2018-04-30 DIAGNOSIS — E118 Type 2 diabetes mellitus with unspecified complications: Secondary | ICD-10-CM

## 2018-04-30 DIAGNOSIS — Z0000 Encounter for general adult medical examination without abnormal findings: Secondary | ICD-10-CM

## 2018-04-30 DIAGNOSIS — N481 Balanitis: Secondary | ICD-10-CM | POA: Insufficient documentation

## 2018-04-30 DIAGNOSIS — I10 Essential (primary) hypertension: Secondary | ICD-10-CM

## 2018-04-30 DIAGNOSIS — E785 Hyperlipidemia, unspecified: Secondary | ICD-10-CM

## 2018-04-30 LAB — COMPREHENSIVE METABOLIC PANEL
ALK PHOS: 51 U/L (ref 39–117)
ALT: 13 U/L (ref 0–53)
AST: 15 U/L (ref 0–37)
Albumin: 4.1 g/dL (ref 3.5–5.2)
BUN: 14 mg/dL (ref 6–23)
CHLORIDE: 104 meq/L (ref 96–112)
CO2: 30 mEq/L (ref 19–32)
Calcium: 9.8 mg/dL (ref 8.4–10.5)
Creatinine, Ser: 1.07 mg/dL (ref 0.40–1.50)
GFR: 96.05 mL/min (ref >60.00)
Glucose, Bld: 198 mg/dL — ABNORMAL HIGH (ref 70–99)
POTASSIUM: 4.2 meq/L (ref 3.5–5.1)
SODIUM: 141 meq/L (ref 135–145)
TOTAL PROTEIN: 7.3 g/dL (ref 6.0–8.3)
Total Bilirubin: 0.7 mg/dL (ref 0.2–1.2)

## 2018-04-30 LAB — LIPID PANEL
Cholesterol: 170 mg/dL (ref 0–200)
HDL: 50.2 mg/dL (ref >39.00)
LDL CALC: 100 mg/dL — AB (ref 0–99)
NONHDL: 119.56
Total CHOL/HDL Ratio: 3
Triglycerides: 96 mg/dL (ref 0.0–149.0)
VLDL: 19.2 mg/dL (ref 0.0–40.0)

## 2018-04-30 LAB — CBC WITH DIFFERENTIAL/PLATELET
BASOS ABS: 0 10*3/uL (ref 0.0–0.1)
Basophils Relative: 0.3 % (ref 0.0–3.0)
EOS PCT: 0.4 % (ref 0.0–5.0)
Eosinophils Absolute: 0 10*3/uL (ref 0.0–0.7)
HCT: 41.6 % (ref 39.0–52.0)
HEMOGLOBIN: 13.9 g/dL (ref 13.0–17.0)
LYMPHS ABS: 2.5 10*3/uL (ref 0.7–4.0)
LYMPHS PCT: 32.7 % (ref 12.0–46.0)
MCHC: 33.5 g/dL (ref 30.0–36.0)
MCV: 85.3 fl (ref 78.0–100.0)
MONOS PCT: 6.9 % (ref 3.0–12.0)
Monocytes Absolute: 0.5 10*3/uL (ref 0.1–1.0)
NEUTROS PCT: 59.7 % (ref 43.0–77.0)
Neutro Abs: 4.6 10*3/uL (ref 1.4–7.7)
Platelets: 335 10*3/uL (ref 150.0–400.0)
RBC: 4.87 Mil/uL (ref 4.22–5.81)
RDW: 14 % (ref 11.5–15.5)
WBC: 7.7 10*3/uL (ref 4.0–10.5)

## 2018-04-30 LAB — MICROALBUMIN / CREATININE URINE RATIO
Creatinine,U: 174.3 mg/dL
Microalb Creat Ratio: 2.1 mg/g (ref 0.0–30.0)
Microalb, Ur: 3.6 mg/dL — ABNORMAL HIGH (ref 0.0–1.9)

## 2018-04-30 LAB — URINALYSIS, ROUTINE W REFLEX MICROSCOPIC
Bilirubin Urine: NEGATIVE
HGB URINE DIPSTICK: NEGATIVE
Ketones, ur: NEGATIVE
Leukocytes, UA: NEGATIVE
Nitrite: NEGATIVE
PH: 5.5 (ref 5.0–8.0)
RBC / HPF: NONE SEEN (ref 0–?)
Specific Gravity, Urine: >=1.03 — AB (ref 1.000–1.030)
TOTAL PROTEIN, URINE-UPE24: NEGATIVE
UROBILINOGEN UA: 0.2 (ref 0.0–1.0)
Urine Glucose: 100 — AB

## 2018-04-30 LAB — HEMOGLOBIN A1C: HEMOGLOBIN A1C: 13.2 % — AB (ref 4.6–6.5)

## 2018-04-30 LAB — PSA: PSA: 0.61 ng/mL (ref 0.10–4.00)

## 2018-04-30 LAB — TSH: TSH: 1.3 u[IU]/mL (ref 0.35–4.50)

## 2018-04-30 LAB — VITAMIN D 25 HYDROXY (VIT D DEFICIENCY, FRACTURES): VITD: 10.58 ng/mL — AB (ref 30.00–100.00)

## 2018-04-30 MED ORDER — AZILSARTAN-CHLORTHALIDONE 40-12.5 MG PO TABS: 1.0000 | ORAL_TABLET | Freq: Every day | ORAL | 0 refills | Status: DC

## 2018-04-30 MED ORDER — METRONIDAZOLE 250 MG PO TABS: 250.0000 mg | ORAL_TABLET | Freq: Three times a day (TID) | ORAL | 0 refills | Status: AC

## 2018-04-30 MED ORDER — CHOLECALCIFEROL 1.25 MG (50000 UT) PO CAPS: 50000.0000 [IU] | ORAL_CAPSULE | ORAL | 1 refills | Status: DC

## 2018-04-30 MED ORDER — GLUCOSE BLOOD VI STRP: ORAL_STRIP | 12 refills | Status: DC

## 2018-04-30 MED ORDER — DEXLANSOPRAZOLE 60 MG PO CPDR: 60.0000 mg | DELAYED_RELEASE_CAPSULE | Freq: Every day | ORAL | 1 refills | Status: DC

## 2018-04-30 NOTE — Patient Instructions (Signed)

## 2018-04-30 NOTE — Progress Notes (Signed)
Subjective:  Patient ID: Howard Calhoun, male    DOB: February 14, 1971  Age: 47 y.o. MRN: [id number]  CC: Hypertension; Hyperlipidemia; Diabetes; and Annual Exam   HPI Howard Calhoun presents for a CPX.  He tells me that his wife has been treated multiple times over the last year for bacterial vaginosis.  He complains that for the last few months he has noticed painless bumps around the base of his penis and a scant amount of discharge.  He says the discharge is not coming from inside the penis and he denies dysuria or hematuria.  He is also concerned that his blood sugar is not well controlled.  He has had polys recently and a blood sugar as high as 400.  He tells me he saw someone else recently and they have started treating his hyperglycemia.  Additionally, he complains of heartburn, odynophagia, and dysphagia.  He is not currently treating this.  He denies loss of appetite but has lost some weight.  Outpatient Medications Prior to Visit  Medication Sig Dispense Refill  . atorvastatin (LIPITOR) 40 MG tablet Take 1 tablet (40 mg total) by mouth daily. 90 tablet 3  . Blood Glucose Monitoring Suppl (ONETOUCH VERIO FLEX SYSTEM) w/Device KIT 1 Act by Does not apply route 3 (three) times daily with meals. 2 kit 0  . glimepiride (AMARYL) 2 MG tablet TK 1 T PO QD  0  . Insulin Detemir (LEVEMIR FLEXTOUCH) 100 UNIT/ML Pen Inject 40 Units into the skin daily at 10 pm. 45 mL 3  . Insulin Pen Needle 32G X 4 MM MISC Use to inject insulin DX E11.8 100 each 3  . linagliptin (TRADJENTA) 5 MG TABS tablet Take 1 tablet (5 mg total) by mouth daily. 90 tablet 3  . glucose blood (ONETOUCH VERIO) test strip Use TID 100 each 12  . Methylsulfonylmethane (MSM) 750 MG CAPS Take 2-3 capsules by mouth daily.    . naproxen (NAPROSYN) 375 MG tablet Take 1 tablet (375 mg total) by mouth 2 (two) times daily. 20 tablet 0  . olmesartan (BENICAR) 40 MG tablet Take 1 tablet (40 mg total) by mouth daily. 30 tablet 0  .  metFORMIN (GLUCOPHAGE) 500 MG tablet Take 1 tablet (500 mg total) by mouth 2 (two) times daily with a meal. 60 tablet 0   No facility-administered medications prior to visit.     ROS Review of Systems  Constitutional: Positive for unexpected weight change. Negative for diaphoresis and fatigue.  HENT: Negative.  Negative for sore throat, trouble swallowing and voice change.   Respiratory: Negative.  Negative for cough, chest tightness, shortness of breath and wheezing.   Cardiovascular: Negative for chest pain, palpitations and leg swelling.  Gastrointestinal: Negative for abdominal pain, constipation, diarrhea, nausea and vomiting.  Endocrine: Positive for polydipsia, polyphagia and polyuria.  Genitourinary: Positive for discharge and genital sores. Negative for decreased urine volume, difficulty urinating, dysuria, flank pain, frequency, hematuria, penile pain, penile swelling, scrotal swelling, testicular pain and urgency.  Musculoskeletal: Negative.  Negative for back pain, myalgias and neck pain.  Skin: Negative for color change and rash.  Neurological: Negative.  Negative for dizziness, weakness, light-headedness and headaches.  Hematological: Negative for adenopathy. Does not bruise/bleed easily.  Psychiatric/Behavioral: Negative.     Objective:  BP (!) 164/114 (BP Location: Left Arm, Patient Position: Sitting, Cuff Size: Large)   Pulse 83   Temp 98.2 F (36.8 C) (Oral)   Resp 16   Ht '5\' 10"'$  (1.778 m)  Wt 240 lb (108.9 kg)   SpO2 98%   BMI 34.44 kg/m   BP Readings from Last 3 Encounters:  04/30/18 (!) 164/114  08/05/17 (!) 198/105  06/25/17 (!) 152/111    Wt Readings from Last 3 Encounters:  04/30/18 240 lb (108.9 kg)  12/21/15 244 lb (110.7 kg)    Physical Exam  Constitutional: He is oriented to person, place, and time. No distress.  HENT:  Mouth/Throat: Oropharynx is clear and moist. No oropharyngeal exudate.  Eyes: Conjunctivae are normal. No scleral icterus.   Neck: Normal range of motion. Neck supple. No JVD present. No thyromegaly present.  Cardiovascular: Normal rate, regular rhythm and normal heart sounds. Exam reveals no gallop and no friction rub.  No murmur heard. Pulmonary/Chest: Effort normal and breath sounds normal. No respiratory distress. He has no wheezes. He has no rales.  Abdominal: Soft. Normal appearance and bowel sounds are normal. He exhibits no mass. There is no hepatosplenomegaly. There is no tenderness. No hernia. Hernia confirmed negative in the right inguinal area and confirmed negative in the left inguinal area.  Genitourinary: Rectum normal, prostate normal, testes normal and penis normal. Rectal exam shows no external hemorrhoid, no internal hemorrhoid, no fissure, no mass, no tenderness, anal tone normal and guaiac negative stool. Prostate is not enlarged and not tender. Right testis shows no mass, no swelling and no tenderness. Left testis shows no mass, no swelling and no tenderness. No phimosis, paraphimosis, hypospadias, penile erythema or penile tenderness. No discharge found.  Genitourinary Comments: Around the base of the glans penis there are minute, pale, papules but there is no exudate or erythema.  There are no ulcers or vesicles.  Musculoskeletal: Normal range of motion. He exhibits no edema, tenderness or deformity.  Lymphadenopathy:    He has no cervical adenopathy. No inguinal adenopathy noted on the right or left side.  Neurological: He is alert and oriented to person, place, and time.  Skin: Skin is warm and dry. No rash noted. He is not diaphoretic.  Psychiatric: He has a normal mood and affect. His behavior is normal. Thought content normal.  Vitals reviewed.   Lab Results  Component Value Date   WBC 7.7 04/30/2018   HGB 13.9 04/30/2018   HCT 41.6 04/30/2018   PLT 335.0 04/30/2018   GLUCOSE 198 (H) 04/30/2018   CHOL 170 04/30/2018   TRIG 96.0 04/30/2018   HDL 50.20 04/30/2018   LDLCALC 100 (H)  04/30/2018   ALT 13 04/30/2018   AST 15 04/30/2018   NA 141 04/30/2018   K 4.2 04/30/2018   CL 104 04/30/2018   CREATININE 1.07 04/30/2018   BUN 14 04/30/2018   CO2 30 04/30/2018   TSH 1.30 04/30/2018   PSA 0.61 04/30/2018   HGBA1C 13.2 (H) 04/30/2018   MICROALBUR 3.6 (H) 04/30/2018    No results found.  Assessment & Plan:   Howard Calhoun was seen today for hypertension, hyperlipidemia, diabetes and annual exam.  Diagnoses and all orders for this visit:  Penile discharge- His urinalysis is negative and screening for gonorrhea and chlamydia is negative.  I am concerned he may have a Gardnerella infection so I have asked him to take a course of metronidazole. -     Cancel: C. trachomatis/N. gonorrhoeae RNA -     Urinalysis, Routine w reflex microscopic; Future -     C. trachomatis/N. gonorrhoeae RNA; Future -     metroNIDAZOLE (FLAGYL) 250 MG tablet; Take 1 tablet (250 mg total) by mouth  3 (three) times daily for 7 days.  Type 2 diabetes mellitus with complication, without long-term current use of insulin ([place])- His A1c is up to 13.2%.  His blood sugars are not adequately well controlled.  I have asked him to see endocrinology to try to gain better control of his blood sugars. -     glucose blood (ONETOUCH VERIO) test strip; Use TID -     Hemoglobin A1c; Future -     Microalbumin / creatinine urine ratio; Future -     Comprehensive metabolic panel; Future -     Ambulatory referral to Ophthalmology -     HM Diabetes Foot Exam -     Azilsartan-Chlorthalidone (EDARBYCLOR) 40-12.5 MG TABS; Take 1 tablet by mouth daily. -     Amb Referral to Nutrition and Diabetic E -     Ambulatory referral to Endocrinology  Gastroesophageal reflux disease with esophagitis- He is symptomatic with this and has some concerning upper GI symptoms so I have asked him to start taking a PPI. -     CBC with Differential/Platelet; Future -     dexlansoprazole (DEXILANT) 60 MG capsule; Take 1 capsule (60 mg total)  by mouth daily.  Essential hypertension- His blood pressure is not adequately well controlled.  I will treat the vitamin D deficiency.  Otherwise, his labs are negative for secondary causes or endorgan damage.  I will upgrade him to a more potent ARB and will add on a thiazide diuretic as well. -     VITAMIN D 25 Hydroxy (Vit-D Deficiency, Fractures); Future -     Comprehensive metabolic panel; Future -     Azilsartan-Chlorthalidone (EDARBYCLOR) 40-12.5 MG TABS; Take 1 tablet by mouth daily.  Hyperlipidemia with target LDL less than 100 - He has achieved his LDL goal and is doing well on the statin. -     TSH; Future -     Comprehensive metabolic panel; Future  Routine general medical examination at a health care facility- Exam completed, labs reviewed, vaccines reviewed and updated, patient education material was given. -     Lipid panel; Future -     PSA; Future  Balanitis circinata -     metroNIDAZOLE (FLAGYL) 250 MG tablet; Take 1 tablet (250 mg total) by mouth 3 (three) times daily for 7 days.  Vitamin D deficiency disease -     Cholecalciferol 50000 units capsule; Take 1 capsule (50,000 Units total) by mouth once a week.  Esophageal dysphagia- He has some alarming upper GI symptoms so I have asked him to see GI to see if he needs to undergo upper endoscopy. -     Ambulatory referral to Gastroenterology   I have discontinued Howard Calhoun's MSM, olmesartan, metFORMIN, and naproxen. I am also having him start on metroNIDAZOLE, Azilsartan-Chlorthalidone, dexlansoprazole, and Cholecalciferol. Additionally, I am having him maintain his linagliptin, ONETOUCH VERIO FLEX SYSTEM, atorvastatin, Insulin Detemir, Insulin Pen Needle, glimepiride, and glucose blood.  Meds ordered this encounter  Medications  . glucose blood (ONETOUCH VERIO) test strip    Sig: Use TID    Dispense:  100 each    Refill:  12  . metroNIDAZOLE (FLAGYL) 250 MG tablet    Sig: Take 1 tablet (250 mg total) by  mouth 3 (three) times daily for 7 days.    Dispense:  21 tablet    Refill:  0  . Azilsartan-Chlorthalidone (EDARBYCLOR) 40-12.5 MG TABS    Sig: Take 1 tablet by mouth daily.  Dispense:  49 tablet    Refill:  0  . dexlansoprazole (DEXILANT) 60 MG capsule    Sig: Take 1 capsule (60 mg total) by mouth daily.    Dispense:  90 capsule    Refill:  1  . Cholecalciferol 50000 units capsule    Sig: Take 1 capsule (50,000 Units total) by mouth once a week.    Dispense:  12 capsule    Refill:  1     Follow-up: Return in about 6 weeks (around 06/11/2018).  Scarlette Calico, MD

## 2018-05-01 LAB — C. TRACHOMATIS/N. GONORRHOEAE RNA
C. trachomatis RNA, TMA: NOT DETECTED
N. gonorrhoeae RNA, TMA: NOT DETECTED

## 2018-05-02 DIAGNOSIS — R1319 Other dysphagia: Secondary | ICD-10-CM | POA: Insufficient documentation

## 2018-05-02 DIAGNOSIS — R1310 Dysphagia, unspecified: Secondary | ICD-10-CM | POA: Insufficient documentation

## 2018-05-15 ENCOUNTER — Encounter: Payer: Self-pay | Admitting: Gastroenterology

## 2018-05-23 ENCOUNTER — Encounter: Payer: Self-pay | Admitting: Internal Medicine

## 2018-05-25 MED ORDER — GLIMEPIRIDE 2 MG PO TABS: 2.0000 mg | ORAL_TABLET | Freq: Every day | ORAL | 1 refills | Status: DC

## 2018-06-10 ENCOUNTER — Encounter: Payer: Self-pay | Admitting: Internal Medicine

## 2018-06-18 ENCOUNTER — Ambulatory Visit: Payer: 59 | Admitting: Registered"

## 2018-07-06 ENCOUNTER — Ambulatory Visit: Payer: 59 | Admitting: Gastroenterology

## 2018-07-07 ENCOUNTER — Encounter: Payer: Self-pay | Admitting: Internal Medicine

## 2018-07-07 ENCOUNTER — Ambulatory Visit (INDEPENDENT_AMBULATORY_CARE_PROVIDER_SITE_OTHER): Payer: 59 | Admitting: Internal Medicine

## 2018-07-07 ENCOUNTER — Telehealth: Payer: Self-pay | Admitting: Internal Medicine

## 2018-07-07 VITALS — BP 144/92 | HR 93 | Ht 70.0 in | Wt 245.8 lb

## 2018-07-07 DIAGNOSIS — E118 Type 2 diabetes mellitus with unspecified complications: Secondary | ICD-10-CM | POA: Diagnosis not present

## 2018-07-07 DIAGNOSIS — E1165 Type 2 diabetes mellitus with hyperglycemia: Secondary | ICD-10-CM | POA: Diagnosis not present

## 2018-07-07 DIAGNOSIS — Z794 Long term (current) use of insulin: Secondary | ICD-10-CM | POA: Diagnosis not present

## 2018-07-07 LAB — POCT GLYCOSYLATED HEMOGLOBIN (HGB A1C): Hemoglobin A1C: 9.3 % — AB (ref 4.0–5.6)

## 2018-07-07 MED ORDER — FREESTYLE LIBRE 14 DAY SENSOR MISC: 2.0000 "application " | Freq: Every day | 11 refills | Status: DC

## 2018-07-07 MED ORDER — GLIMEPIRIDE 2 MG PO TABS: 2.0000 mg | ORAL_TABLET | Freq: Every day | ORAL | 3 refills | Status: DC

## 2018-07-07 MED ORDER — FREESTYLE LIBRE 14 DAY READER DEVI: 1.0000 | Freq: Every day | 0 refills | Status: DC

## 2018-07-07 MED ORDER — INSULIN DETEMIR 100 UNIT/ML FLEXPEN: 40.0000 [IU] | PEN_INJECTOR | Freq: Every day | SUBCUTANEOUS | 3 refills | Status: DC

## 2018-07-07 NOTE — Progress Notes (Signed)
Name: Howard Calhoun  MRN/ DOB: [id number], 09/18/1970   Age/ Sex: 47 y.o., male    PCP: Janith Lima, MD   Reason for Endocrinology Evaluation: Type 2 Diabetes Mellitus     Date of Initial Endocrinology Visit: 07/07/2018     PATIENT IDENTIFIER: Howard Calhoun is a 47 y.o. male with a past medical history of  HTN, T2DM and dyslipidemia. The patient presented for initial ndocrinology clinic visit on 07/07/2018 for consultative assistance with his diabetes management.    HPI: Howard Calhoun wasc  Diagnosed with T2DM  > 5 yrs ago Prior Medications tried: Metformin- diarrhea  Hypoglycemia episodes : 0               Hemoglobin A1c has ranged from 13.2 % in 2017, peaking at 13.2 % in 2019. Currently checking blood sugars 1 x / day,  before breakfast. Has not checked for the past week (was at a camp with his son) Patient required assistance for hypoglycemia: No Patient has required hospitalization within the last 1 year from hyper or hypoglycemia: No  In terms of diet, the patient eats mainly 2 meals a day, he does like to snack on snickers, he also drinks sweet tea  Patient admits that in the past few years he was not taking care of his health and taking nothing for diabetes. He was initially on Metformin but caused severe diarrhea. Two months ago he presented to his PCP after a 2 year loss of follow up.  He admits to forgetting to take levelmir- On average he takes it 4x a week. He is not sure he is taking Tradjenta , it seems its expensive $40 . He typically takes Amaryl but the past week he went on a camping trip with his 14 year old son and did not take any of his meds nor glucose meter. Patient admits not being comfortable with  Checking glucose or injecting insulin in front of others.  T FH: Father with diabetes on HD. 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   Decreased libido: Patient  is c/o decreased libido and difficulty maintaining erections for some time. He does wake up in the morning /middle of the night with erections but this has not been as frequent as it used to be. He does snore, he feels tired mainly in the morning. Tends to take a nap in the middle of the day.     HOME DIABETES REGIMEN: Levemir 40 units QHS Glimepiride 2 mg QD Tradjenta 5 mg daily     Statin: Yes ACE-I/ARB: Yes Prior Diabetic Education: 2017   METER DOWNLOAD SUMMARY: Did not bring meter    BG Ranges: Prebreakfast 170's last time he checked     DIABETIC COMPLICATIONS: Microvascular complications:   Neuropathy   Denies: nephropathy   Last eye exam: Completed > 1 yr   Macrovascular complications:    Denies: CAD, PVD, CVA   PAST HISTORY: Past Medical History:  Past Medical History:  Diagnosis Date  . Diabetes mellitus without complication ([place])   . Hyperlipidemia   . Hypertension    Past Surgical History:  Past Surgical History:  Procedure Laterality Date  . APPENDECTOMY  1992  . ARTHROSCOPIC REPAIR ACL  1990  . TONSILECTOMY/ADENOIDECTOMY WITH MYRINGOTOMY        Social History:  reports that he has never smoked. He has never used smokeless  tobacco. He reports that he does not drink alcohol or use drugs. Family History:  Family History  Problem Relation Age of Onset  . Hypertension Mother   . Diabetes Father   . Heart disease Father   . Kidney disease Father   . Hodgkin's lymphoma Father        remission -2 years  . Cancer Neg Hx   . Early death Neg Hx   . Stroke Neg Hx      HOME MEDICATIONS: Allergies as of 07/07/2018      Reactions   Metformin And Related Diarrhea      Medication List        Accurate as of 07/07/18 11:37 AM. Always use your most recent med list.          atorvastatin 40 MG tablet Commonly known as:  LIPITOR Take 1 tablet (40 mg total) by mouth daily.   Azilsartan-Chlorthalidone 40-12.5 MG Tabs Take 1 tablet by  mouth daily.   Cholecalciferol 50000 units capsule Take 1 capsule (50,000 Units total) by mouth once a week.   dexlansoprazole 60 MG capsule Commonly known as:  DEXILANT Take 1 capsule (60 mg total) by mouth daily.   glimepiride 2 MG tablet Commonly known as:  AMARYL Take 1 tablet (2 mg total) by mouth daily with breakfast.   glucose blood test strip Use TID   Insulin Detemir 100 UNIT/ML Pen Commonly known as:  LEVEMIR Inject 40 Units into the skin daily at 10 pm.   Insulin Pen Needle 32G X 4 MM Misc Use to inject insulin DX E11.8   linagliptin 5 MG Tabs tablet Commonly known as:  TRADJENTA Take 1 tablet (5 mg total) by mouth daily.   Ventana w/Device Kit 1 Act by Does not apply route 3 (three) times daily with meals.        ALLERGIES: Allergies  Allergen Reactions  . Metformin And Related Diarrhea     REVIEW OF SYSTEMS: A comprehensive ROS was conducted with the patient and is negative except as per HPI and below:  Review of Systems  Constitutional: Negative for malaise/fatigue and weight loss.  HENT: Positive for congestion. Negative for sore throat.   Eyes: Negative for blurred vision and pain.  Respiratory: Positive for cough. Negative for shortness of breath.   Cardiovascular: Negative for chest pain and palpitations.  Gastrointestinal: Negative for diarrhea and nausea.  Genitourinary: Positive for frequency.       Patient is circumsiced  Neurological: Positive for tingling.       Of 5th toes.   Endo/Heme/Allergies: Positive for polydipsia.  Psychiatric/Behavioral: Negative for depression. The patient is not nervous/anxious.       OBJECTIVE:   VITAL SIGNS: BP (!) 144/92 (BP Location: Right Arm)   Pulse 93   Ht '5\' 10"'$  (1.778 m)   Wt 111.5 kg   SpO2 98%   BMI 35.27 kg/m    PHYSICAL EXAM:  General: Pt appears well and is in NAD  Hydration: Well-hydrated with moist mucous membranes and good skin turgor  HEENT: Head:  Unremarkable with good dentition. Oropharynx clear without exudate.  Eyes: External eye exam normal without stare, lid lag or exophthalmos.  EOM intact.  PERRL.  Neck: General: Supple without adenopathy or carotid bruits. Thyroid: Thyroid size normal.  No goiter or nodules appreciated. No thyroid bruit.  Lungs: Clear with good BS bilat with no rales, rhonchi, or wheezes  Heart: RRR with normal S1 and S2 and no gallops;  no murmurs; no rub  Abdomen: Normoactive bowel sounds, soft, nontender, without masses or organomegaly palpable  Extremities:  Lower extremities - No pretibial edema. No lesions.  Skin: Normal texture and temperature to palpation. No rash noted. No Acanthosis nigricans/skin tags. No lipohypertrophy.  Neuro: MS is good with appropriate affect, pt is alert and Ox3    DM foot exam: 07/07/2018  The skin of the feet is intact without sores or ulcerations. Lateral deviation of toes.  The pedal pulses are 2+ on right and 2+ on left. The sensation is intact to a screening 5.07, 10 gram monofilament bilaterally   DATA REVIEWED:   Results for Howard Calhoun, Howard Calhoun (MRN [id number]) as of 07/07/2018 10:41  Ref. Range 12/21/2015 16:51 04/30/2018 10:52  Hemoglobin A1C Latest Ref Range: 4.6 - 6.5 % 13.2 (H) 13.2 (H)  Results for Howard Calhoun, Howard Calhoun (MRN [id number]) as of 07/07/2018 12:57  Ref. Range 07/07/2018 11:30  Hemoglobin A1C Latest Ref Range: 4.0 - 5.6 % 9.3 (A)    CMP     Component Value Date/Time   NA 141 04/30/2018 1052   K 4.2 04/30/2018 1052   CL 104 04/30/2018 1052   CO2 30 04/30/2018 1052   GLUCOSE 198 (H) 04/30/2018 1052   BUN 14 04/30/2018 1052   CREATININE 1.07 04/30/2018 1052   CALCIUM 9.8 04/30/2018 1052   PROT 7.3 04/30/2018 1052   ALBUMIN 4.1 04/30/2018 1052   AST 15 04/30/2018 1052   ALT 13 04/30/2018 1052   ALKPHOS 51 04/30/2018 1052   BILITOT 0.7 04/30/2018 1052      ASSESSMENT / PLAN / RECOMMENDATIONS:   1) Type 2 Diabetes Mellitus, Poorly  controlled, With neuropathy complications - Most recent A1c of 13.2 %. Goal A1c < 7.0  %.     GENERAL: I have discussed with the patient the pathophysiology of diabetes. We went over the natural progression of the disease. We talked about both insulin resistance and insulin deficiency. We stressed the importance of lifestyle changes including diet and exercise. I explained the complications associated with diabetes including retinopathy, nephropathy, neuropathy as well as increased risk of cardiovascular disease. We went over the benefit seen with glycemic control.     He was advised to eat consistent meals and avoid snacking. He was also advised to avoid sugar-sweetened beverages.   Encouraged to exercise 150 min/week  Encouraged to check glucose before meals and bedtime  Not sure if he is taking Tradjenta but I am impressed with his A1c trending down by 4.0% within 2 months   We discussed that tradjenta will reduce his A1c by 0.4-0.5% which in his case is too costly $40 per month. I suggested we try other medications such as GLP-1 agonists vs SGLT-2 inhibitors we discussed the benefits of reducing A1c by 1.0% and weight loss we also discussed side effects of GLP-1 agonist such as nausea and that its an injectable  Vs side effects of SGLT-2 inhibitors such as genital infections including fournier gangrene .   Patient opted to try Trulicity. He was educated on pen usage in the office today.   It is not recommended to combine GLP-1 agonists and DPP4 inhibitors , hence will stop Tradjenta   MEDICATIONS:  Stop Tradjenta  Continue Levemir 40 units QHS  Continue Amaryl 2 mg QD  Start Trulicity 3.15 mg weekly (he was given a coupon)  EDUCATION / INSTRUCTIONS:  BG monitoring instructions: Patient is instructed to check his blood sugars 2 times a day, fasting and bedtime.  Call [facility] if: BG persistently < 70 or > 300. . I reviewed the Rule of 15 for the treatment  of hypoglycemia in detail with the patient. Literature supplied.   2) Diabetic complications:   Eye: Does not have known diabetic retinopathy. Last eye exam was >1 yr ago. Has pending one  Neuro/ Feet: Does have known diabetic peripheral neuropathy.  Renal: Patient does not have known baseline CKD. He is on an ACEI/ARB at present.   3) Lipids: Patient is on a statin.    4) Hypertension: He is not at goal of < 140/90 mmHg. Will defer to PCP    5) Decreased Libido : This is most likely multifactorial (DM, HTN , dyslipidemia), he could also have low testosterone level but given his fatigue, snoring and BMI of 35, I believe he is at a higher risk for Obstructive sleep apnea. Patient advised testosterone therapy will not be started if needed without having a sleep study first. As testosterone therapy will worsen OSA.  I will leave that to Dr. Ronnald Ramp discretion.    At least 45 minutes was spent with patient, > 50% was spent in counseling, education and answering questions.   F/U in 5 weeks    Signed electronically by: Mack Guise, MD  [facility]  [facility] [address] [place], [place] [address] Phone: [phone] FAX: [phone]   CC: Janith Lima, MD [address] [place] [address] Phone: [phone]  Fax: [phone]    Return to Endocrinology clinic as below: Future Appointments  Date Time Provider [place]  08/05/2018  3:00 PM Milus Banister, MD [facility]

## 2018-07-07 NOTE — Patient Instructions (Addendum)
-   Stop Tradjenta - Start Trulicity once a week injection at 0.75 mg  - Continue Glimepiride 2 mg with first meal - Continue Levemir at 40 units bedtime - Check sugar bedore each meal and at bedtime - If you are going to use the [employer]- make sure you swipe it before meals and bedtime   - HOW TO TREAT LOW BLOOD SUGARS (Blood sugar LESS THAN 70 MG/DL)  Please follow the RULE OF 15 for the treatment of hypoglycemia treatment (when your (blood sugars are less than 70 mg/dL)    STEP 1: Take 15 grams of carbohydrates when your blood sugar is low, which includes:   3-4 GLUCOSE TABS  OR  3-4 OZ OF JUICE OR REGULAR SODA OR  ONE TUBE OF GLUCOSE GEL     STEP 2: RECHECK blood sugar in 15 MINUTES STEP 3: If your blood sugar is still low at the 15 minute recheck --> then, go back to STEP 1 and treat AGAIN with another 15 grams of carbohydrates.

## 2018-07-07 NOTE — Telephone Encounter (Signed)
Pt went to pharmacy and stated his Trulicity wasn't called in but his other medication was.

## 2018-07-08 ENCOUNTER — Ambulatory Visit (INDEPENDENT_AMBULATORY_CARE_PROVIDER_SITE_OTHER): Payer: 59 | Admitting: Internal Medicine

## 2018-07-08 ENCOUNTER — Other Ambulatory Visit: Payer: Self-pay | Admitting: Internal Medicine

## 2018-07-08 ENCOUNTER — Encounter: Payer: Self-pay | Admitting: Internal Medicine

## 2018-07-08 VITALS — BP 130/84 | HR 80 | Temp 98.1°F | Resp 16 | Ht 70.0 in | Wt 248.0 lb

## 2018-07-08 DIAGNOSIS — E118 Type 2 diabetes mellitus with unspecified complications: Secondary | ICD-10-CM | POA: Diagnosis not present

## 2018-07-08 DIAGNOSIS — I10 Essential (primary) hypertension: Secondary | ICD-10-CM | POA: Diagnosis not present

## 2018-07-08 DIAGNOSIS — N5201 Erectile dysfunction due to arterial insufficiency: Secondary | ICD-10-CM | POA: Diagnosis not present

## 2018-07-08 DIAGNOSIS — Z23 Encounter for immunization: Secondary | ICD-10-CM | POA: Diagnosis not present

## 2018-07-08 MED ORDER — GLIMEPIRIDE 2 MG PO TABS: 2.0000 mg | ORAL_TABLET | Freq: Every day | ORAL | 3 refills | Status: DC

## 2018-07-08 MED ORDER — INSULIN DETEMIR 100 UNIT/ML FLEXPEN: 40.0000 [IU] | PEN_INJECTOR | Freq: Every day | SUBCUTANEOUS | 3 refills | Status: DC

## 2018-07-08 MED ORDER — AVANAFIL 200 MG PO TABS: 1.0000 | ORAL_TABLET | Freq: Every day | ORAL | 5 refills | Status: DC | PRN

## 2018-07-08 MED ORDER — INSULIN PEN NEEDLE 32G X 4 MM MISC: 3 refills | Status: DC

## 2018-07-08 MED ORDER — AZILSARTAN-CHLORTHALIDONE 40-12.5 MG PO TABS: 1.0000 | ORAL_TABLET | Freq: Every day | ORAL | 1 refills | Status: DC

## 2018-07-08 MED ORDER — DULAGLUTIDE 0.75 MG/0.5ML ~~LOC~~ SOAJ: 0.7500 mg | SUBCUTANEOUS | 1 refills | Status: DC

## 2018-07-08 NOTE — Telephone Encounter (Signed)
Dr. Deloria Lair, please see message below

## 2018-07-08 NOTE — Patient Instructions (Signed)

## 2018-07-08 NOTE — Progress Notes (Signed)
Subjective:  Patient ID: Howard Calhoun, male    DOB: [birth year]  Age: 47 y.o. MRN: [id number]  CC: Hypertension   HPI Howard Calhoun presents for a BP check - He tells me his blood pressure has been well controlled.  He has felt well recently with the exception of erectile dysfunction.  Outpatient Medications Prior to Visit  Medication Sig Dispense Refill  . atorvastatin (LIPITOR) 40 MG tablet Take 1 tablet (40 mg total) by mouth daily. 90 tablet 3  . Blood Glucose Monitoring Suppl (ONETOUCH VERIO FLEX SYSTEM) w/Device KIT 1 Act by Does not apply route 3 (three) times daily with meals. 2 kit 0  . Cholecalciferol 50000 units capsule Take 1 capsule (50,000 Units total) by mouth once a week. 12 capsule 1  . Continuous Blood Gluc Receiver (FREESTYLE LIBRE 14 DAY READER) DEVI 1 applicator by Does not apply route daily. 1 Device 0  . Continuous Blood Gluc Sensor (FREESTYLE LIBRE 14 DAY SENSOR) MISC 2 application by Does not apply route daily. 2 each 11  . dexlansoprazole (DEXILANT) 60 MG capsule Take 1 capsule (60 mg total) by mouth daily. 90 capsule 1  . Dulaglutide (TRULICITY) 9.67 EL/3.8BO SOPN Inject 0.75 mg into the skin once a week. 4 pen 1  . glimepiride (AMARYL) 2 MG tablet Take 1 tablet (2 mg total) by mouth daily with breakfast. 30 tablet 3  . glucose blood (ONETOUCH VERIO) test strip Use TID 100 each 12  . Insulin Detemir (LEVEMIR FLEXTOUCH) 100 UNIT/ML Pen Inject 40 Units into the skin daily at 10 pm. 45 mL 3  . Insulin Pen Needle 32G X 4 MM MISC Use to inject insulin DX E11.8 100 each 3  . Azilsartan-Chlorthalidone (EDARBYCLOR) 40-12.5 MG TABS Take 1 tablet by mouth daily. 49 tablet 0  . olmesartan (BENICAR) 40 MG tablet Take 1 tablet by mouth daily.  3   No facility-administered medications prior to visit.     ROS Review of Systems  Constitutional: Negative.  Negative for appetite change, diaphoresis, fatigue and unexpected weight change.  HENT: Negative.   Eyes:  Negative for visual disturbance.  Respiratory: Negative for cough, chest tightness, shortness of breath and wheezing.   Cardiovascular: Negative for chest pain, palpitations and leg swelling.  Gastrointestinal: Negative for abdominal pain, diarrhea, nausea and vomiting.  Genitourinary: Negative.  Negative for difficulty urinating.  Musculoskeletal: Negative.  Negative for myalgias.  Skin: Negative.   Neurological: Negative.  Negative for dizziness, weakness, light-headedness and headaches.  Hematological: Negative for adenopathy. Does not bruise/bleed easily.  Psychiatric/Behavioral: Negative.     Objective:  BP 130/84 (BP Location: Left Arm, Patient Position: Sitting, Cuff Size: Large)   Pulse 80   Temp 98.1 F (36.7 C) (Oral)   Resp 16   Ht '5\' 10"'$  (1.778 m)   Wt 248 lb (112.5 kg)   SpO2 98%   BMI 35.58 kg/m   BP Readings from Last 3 Encounters:  07/08/18 130/84  07/07/18 (!) 144/92  04/30/18 (!) 164/114    Wt Readings from Last 3 Encounters:  07/08/18 248 lb (112.5 kg)  07/07/18 245 lb 12.8 oz (111.5 kg)  04/30/18 240 lb (108.9 kg)    Physical Exam  Constitutional: He is oriented to person, place, and time. No distress.  HENT:  Mouth/Throat: Oropharynx is clear and moist. No oropharyngeal exudate.  Eyes: Conjunctivae are normal. No scleral icterus.  Neck: Normal range of motion. Neck supple. No thyromegaly present.  Cardiovascular: Normal rate, regular rhythm  and normal heart sounds.  No murmur heard. Pulmonary/Chest: Effort normal and breath sounds normal. No respiratory distress. He has no wheezes. He has no rales.  Abdominal: Soft. Bowel sounds are normal. He exhibits no mass. There is no hepatosplenomegaly. There is no tenderness.  Musculoskeletal: Normal range of motion. He exhibits no edema, tenderness or deformity.  Lymphadenopathy:    He has no cervical adenopathy.  Neurological: He is alert and oriented to person, place, and time.  Skin: Skin is warm and  dry. No rash noted. He is not diaphoretic.  Vitals reviewed.   Lab Results  Component Value Date   WBC 7.7 04/30/2018   HGB 13.9 04/30/2018   HCT 41.6 04/30/2018   PLT 335.0 04/30/2018   GLUCOSE 198 (H) 04/30/2018   CHOL 170 04/30/2018   TRIG 96.0 04/30/2018   HDL 50.20 04/30/2018   LDLCALC 100 (H) 04/30/2018   ALT 13 04/30/2018   AST 15 04/30/2018   NA 141 04/30/2018   K 4.2 04/30/2018   CL 104 04/30/2018   CREATININE 1.07 04/30/2018   BUN 14 04/30/2018   CO2 30 04/30/2018   TSH 1.30 04/30/2018   PSA 0.61 04/30/2018   HGBA1C 9.3 (A) 07/07/2018   MICROALBUR 3.6 (H) 04/30/2018    No results found.  Assessment & Plan:   Zaniel was seen today for hypertension.  Diagnoses and all orders for this visit:  Need for influenza vaccination -     Flu Vaccine QUAD 36+ mos IM  Essential hypertension- His blood pressure is well controlled on the combination of an ARB and thiazide diuretic.  Will monitor his electrolytes and renal function. -     Azilsartan-Chlorthalidone (EDARBYCLOR) 40-12.5 MG TABS; Take 1 tablet by mouth daily. -     Basic metabolic panel; Future  Type 2 diabetes mellitus with complication, without long-term current use of insulin ([place])- His blood sugars are not adequately well controlled.  He is working with endocrinology to lower his blood sugars. -     Azilsartan-Chlorthalidone (EDARBYCLOR) 40-12.5 MG TABS; Take 1 tablet by mouth daily. -     Basic metabolic panel; Future  Erectile dysfunction due to arterial insufficiency -     Avanafil (STENDRA) 200 MG TABS; Take 1 tablet by mouth daily as needed.   I have discontinued Jeneen Rinks T. Raspberry's olmesartan. I am also having him start on Avanafil. Additionally, I am having him maintain his Pence, atorvastatin, glucose blood, dexlansoprazole, Cholecalciferol, FREESTYLE LIBRE 14 DAY READER, FREESTYLE LIBRE 14 DAY SENSOR, glimepiride, Insulin Detemir, Insulin Pen Needle, Dulaglutide, and  Azilsartan-Chlorthalidone.  Meds ordered this encounter  Medications  . Azilsartan-Chlorthalidone (EDARBYCLOR) 40-12.5 MG TABS    Sig: Take 1 tablet by mouth daily.    Dispense:  90 tablet    Refill:  1  . Avanafil (STENDRA) 200 MG TABS    Sig: Take 1 tablet by mouth daily as needed.    Dispense:  8 tablet    Refill:  5     Follow-up: Return in about 6 months (around 01/07/2019).  Scarlette Calico, MD

## 2018-07-08 NOTE — Progress Notes (Signed)
Refills provided 

## 2018-07-08 NOTE — Telephone Encounter (Signed)
Attempted to reach pt, no answer unable to leave a vm. Rx was faxed to [facility]

## 2018-07-15 ENCOUNTER — Telehealth: Payer: Self-pay | Admitting: Internal Medicine

## 2018-07-15 DIAGNOSIS — E118 Type 2 diabetes mellitus with unspecified complications: Secondary | ICD-10-CM

## 2018-07-15 DIAGNOSIS — I10 Essential (primary) hypertension: Secondary | ICD-10-CM

## 2018-07-15 NOTE — Telephone Encounter (Signed)
Copied from CRM [id number]. Topic: General - Other >> Jul 15, 2018  2:52 PM Percival Spanish wrote:  Pt req samples Azilsartan-Chlorthalidone (EDARBYCLOR) 40-12.5 MG TABS  he has to wait till his mail order comes

## 2018-07-17 ENCOUNTER — Other Ambulatory Visit: Payer: Self-pay | Admitting: Internal Medicine

## 2018-07-17 DIAGNOSIS — I10 Essential (primary) hypertension: Secondary | ICD-10-CM

## 2018-07-17 DIAGNOSIS — E118 Type 2 diabetes mellitus with unspecified complications: Secondary | ICD-10-CM

## 2018-07-17 MED ORDER — AZILSARTAN-CHLORTHALIDONE 40-12.5 MG PO TABS: 1.0000 | ORAL_TABLET | Freq: Every day | ORAL | 0 refills | Status: DC

## 2018-07-17 MED ORDER — AZILSARTAN-CHLORTHALIDONE 40-12.5 MG PO TABS: 1.0000 | ORAL_TABLET | Freq: Every day | ORAL | 1 refills | Status: DC

## 2018-07-17 NOTE — Telephone Encounter (Signed)
I have 5 bottles ready for pt to pick up. It is on my desk. Detailed message left for pt to pick up samples at his convenience.

## 2018-07-28 ENCOUNTER — Telehealth: Payer: Self-pay | Admitting: *Deleted

## 2018-07-28 NOTE — Telephone Encounter (Signed)
Stendra 200 mg PA is approved through 07/22/2019.

## 2018-08-05 ENCOUNTER — Ambulatory Visit (INDEPENDENT_AMBULATORY_CARE_PROVIDER_SITE_OTHER): Payer: 59 | Admitting: Gastroenterology

## 2018-08-05 ENCOUNTER — Encounter: Payer: Self-pay | Admitting: Gastroenterology

## 2018-08-05 VITALS — BP 130/72 | HR 76 | Ht 70.0 in | Wt 245.1 lb

## 2018-08-05 DIAGNOSIS — R1310 Dysphagia, unspecified: Secondary | ICD-10-CM

## 2018-08-05 MED ORDER — OMEPRAZOLE 40 MG PO CPDR: 40.0000 mg | DELAYED_RELEASE_CAPSULE | Freq: Every day | ORAL | 5 refills | Status: DC

## 2018-08-05 NOTE — Patient Instructions (Addendum)
Omeprazole 40mg  pill, one pill once daily, disp 30 with 5 refills.  You will be set up for an upper endoscopy for dysphagia.  We have sent the following medications to your pharmacy for you to pick up at your convenience: Omeprazole  Thank you for entrusting me with your care and choosing [facility].  Dr Christella HartiganJacobs

## 2018-08-05 NOTE — Progress Notes (Signed)
HPI: This is a very pleasant 47 year old man who was referred to me by Janith Lima, MD  to evaluate intermittent dysphasia.    Chief complaint is intermittent dysphasia  2 years of intermittent dypshagia, solid food only.  He feels like the food will get lodged at his upper sternal notch.  Sometimes he will have to vomit it out.  He pays a lot of attention to eat very slowly, take small bites to avoid these situations.  He does get heartburn, but only rarely.  He does not take any antiacid medicines routinely.  No nausea or vomiting and no abdominal pains.  Overall his weight has been stable.  Old Data Reviewed: Hemoglobin A1c July 2019 was 13.2.  Hemoglobin A1c October 2019 was 9.3 CBC and complete metabolic profile August 5009 was normal except for very elevated blood sugar   Review of systems: Pertinent positive and negative review of systems were noted in the above HPI section. All other review negative.   Past Medical History:  Diagnosis Date  . Diabetes mellitus without complication ([place])   . Hyperlipidemia   . Hypertension     Past Surgical History:  Procedure Laterality Date  . APPENDECTOMY  1992  . ARTHROSCOPIC REPAIR ACL  1990  . TONSILECTOMY/ADENOIDECTOMY WITH MYRINGOTOMY      Current Outpatient Medications  Medication Sig Dispense Refill  . atorvastatin (LIPITOR) 40 MG tablet Take 1 tablet (40 mg total) by mouth daily. 90 tablet 3  . Avanafil (STENDRA) 200 MG TABS Take 1 tablet by mouth daily as needed. 8 tablet 5  . Azilsartan-Chlorthalidone (EDARBYCLOR) 40-12.5 MG TABS Take 1 tablet by mouth daily. 90 tablet 1  . Blood Glucose Monitoring Suppl (ONETOUCH VERIO FLEX SYSTEM) w/Device KIT 1 Act by Does not apply route 3 (three) times daily with meals. 2 kit 0  . Continuous Blood Gluc Receiver (FREESTYLE LIBRE 14 DAY READER) DEVI 1 applicator by Does not apply route daily. 1 Device 0  . Continuous Blood Gluc Sensor (FREESTYLE LIBRE 14 DAY SENSOR) MISC 2 application  by Does not apply route daily. 2 each 11  . Dulaglutide (TRULICITY) 3.81 WE/9.9BZ SOPN Inject 0.75 mg into the skin once a week. 4 pen 1  . glimepiride (AMARYL) 2 MG tablet Take 1 tablet (2 mg total) by mouth daily with breakfast. 30 tablet 3  . glucose blood (ONETOUCH VERIO) test strip Use TID 100 each 12  . Insulin Detemir (LEVEMIR FLEXTOUCH) 100 UNIT/ML Pen Inject 40 Units into the skin daily at 10 pm. 45 mL 3  . Insulin Pen Needle 32G X 4 MM MISC Use to inject insulin DX E11.8 100 each 3   No current facility-administered medications for this visit.     Allergies as of 08/05/2018 - Review Complete 08/05/2018  Allergen Reaction Noted  . Metformin and related Diarrhea 04/30/2018    Family History  Problem Relation Age of Onset  . Hypertension Mother   . Diabetes Father   . Heart disease Father   . Kidney disease Father   . Hodgkin's lymphoma Father        remission -2 years  . Colon cancer Paternal Grandmother   . Cancer Neg Hx   . Early death Neg Hx   . Stroke Neg Hx     Social History   Socioeconomic History  . Marital status: Married    Spouse name: Not on file  . Number of children: 3  . Years of education: Not on file  . Highest  education level: Not on file  Occupational History  . Occupation: Retail buyer  Social Needs  . Financial resource strain: Not on file  . Food insecurity:    Worry: Not on file    Inability: Not on file  . Transportation needs:    Medical: Not on file    Non-medical: Not on file  Tobacco Use  . Smoking status: Never Smoker  . Smokeless tobacco: Never Used  Substance and Sexual Activity  . Alcohol use: No    Comment: 1-2 a month  . Drug use: No  . Sexual activity: Yes    Birth control/protection: None  Lifestyle  . Physical activity:    Days per week: Not on file    Minutes per session: Not on file  . Stress: Not on file  Relationships  . Social connections:    Talks on phone: Not on file    Gets together: Not on file     Attends religious service: Not on file    Active member of club or organization: Not on file    Attends meetings of clubs or organizations: Not on file    Relationship status: Not on file  . Intimate partner violence:    Fear of current or ex partner: Not on file    Emotionally abused: Not on file    Physically abused: Not on file    Forced sexual activity: Not on file  Other Topics Concern  . Not on file  Social History Narrative  . Not on file     Physical Exam: BP 130/72   Pulse 76   Ht '5\' 10"'$  (1.778 m)   Wt 245 lb 2 oz (111.2 kg)   BMI 35.17 kg/m  Constitutional: generally well-appearing Psychiatric: alert and oriented x3 Eyes: extraocular movements intact Mouth: oral pharynx moist, no lesions Neck: supple no lymphadenopathy Cardiovascular: heart regular rate and rhythm Lungs: clear to auscultation bilaterally Abdomen: soft, nontender, nondistended, no obvious ascites, no peritoneal signs, normal bowel sounds Extremities: no lower extremity edema bilaterally Skin: no lesions on visible extremities   Assessment and plan: 47 y.o. male with intermittent, slightly progressive, solid food only dysphagia.  He does not get heartburn very often but I did explain to him that acid is such a common culprit in causing irritation of the esophagus that can lead to dysphasia that I am putting him on proton pump inhibitor once daily for now.  I recommend an upper endoscopy to exclude other causes as well.  He knows to continue to eat his food slowly, take small bites and chew very well for now.    Please see the "Patient Instructions" section for addition details about the plan.   Owens Loffler, MD [facility] 08/05/2018, 3:03 PM  Cc: Janith Lima, MD

## 2018-08-10 ENCOUNTER — Ambulatory Visit: Payer: 59 | Admitting: Internal Medicine

## 2018-08-31 ENCOUNTER — Encounter: Payer: Self-pay | Admitting: Gastroenterology

## 2018-09-03 ENCOUNTER — Telehealth: Payer: Self-pay | Admitting: Gastroenterology

## 2018-09-03 NOTE — Telephone Encounter (Signed)
No charge. 

## 2018-09-03 NOTE — Telephone Encounter (Signed)
Patient cx procedure EGD for Monday states that he needs to go out of town, will cb to reschedule  Will you charge?

## 2018-09-07 ENCOUNTER — Encounter: Payer: 59 | Admitting: Gastroenterology

## 2018-10-29 ENCOUNTER — Other Ambulatory Visit: Payer: Self-pay | Admitting: Internal Medicine

## 2018-10-29 DIAGNOSIS — E785 Hyperlipidemia, unspecified: Secondary | ICD-10-CM

## 2018-10-29 DIAGNOSIS — E118 Type 2 diabetes mellitus with unspecified complications: Secondary | ICD-10-CM

## 2018-10-29 NOTE — Telephone Encounter (Signed)
Copied from CRM [id number]. Topic: Quick Communication - Rx Refill/Question >> Oct 29, 2018  3:29 PM Mickel Baas B, NT wrote: Medication: atorvastatin (LIPITOR) 40 MG tablet   Has the patient contacted their pharmacy? Yes.   (Agent: If no, request that the patient contact the pharmacy for the refill.) (Agent: If yes, when and what did the pharmacy advise?)  Preferred Pharmacy (with phone number or street name): [id number] - [place], [place] - [address]  Agent: Please be advised that RX refills may take up to 3 business days. We ask that you follow-up with your pharmacy.

## 2018-10-29 NOTE — Telephone Encounter (Signed)
Requested medication (s) are due for refill today - Patient is requesting medication written 12/25/15  Requested medication (s) are on the active medication list -yes  Future visit scheduled -no  Last refill: not showing on medication list- 12/25/15 3 RF  Notes to clinic: Patient is requesting a medication that looks like it has expired- but is current on patient medication list.  Requested Prescriptions  Pending Prescriptions Disp Refills   atorvastatin (LIPITOR) 40 MG tablet 90 tablet 3    Sig: Take 1 tablet (40 mg total) by mouth daily.     Cardiovascular:  Antilipid - Statins Failed - 10/29/2018  3:32 PM      Failed - LDL in normal range and within 360 days    LDL Cholesterol  Date Value Ref Range Status  04/30/2018 100 (H) 0 - 99 mg/dL Final         Passed - Total Cholesterol in normal range and within 360 days    Cholesterol  Date Value Ref Range Status  04/30/2018 170 0 - 200 mg/dL Final    Comment:    ATP III Classification       Desirable:  < 200 mg/dL               Borderline High:  200 - 239 mg/dL          High:  > = 827 mg/dL         Passed - HDL in normal range and within 360 days    HDL  Date Value Ref Range Status  04/30/2018 50.20 >39.00 mg/dL Final         Passed - Triglycerides in normal range and within 360 days    Triglycerides  Date Value Ref Range Status  04/30/2018 96.0 0.0 - 149.0 mg/dL Final    Comment:    Normal:  <150 mg/dLBorderline High:  150 - 199 mg/dL         Passed - Patient is not pregnant      Passed - Valid encounter within last 12 months    Recent Outpatient Visits          3 months ago Need for influenza vaccination   [facility] -Madelin Rear, MD   6 months ago Penile discharge   [facility] -Madelin Rear, MD   2 years ago Essential hypertension   [facility] -Madelin Rear, MD              Requested Prescriptions  Pending  Prescriptions Disp Refills   atorvastatin (LIPITOR) 40 MG tablet 90 tablet 3    Sig: Take 1 tablet (40 mg total) by mouth daily.     Cardiovascular:  Antilipid - Statins Failed - 10/29/2018  3:32 PM      Failed - LDL in normal range and within 360 days    LDL Cholesterol  Date Value Ref Range Status  04/30/2018 100 (H) 0 - 99 mg/dL Final         Passed - Total Cholesterol in normal range and within 360 days    Cholesterol  Date Value Ref Range Status  04/30/2018 170 0 - 200 mg/dL Final    Comment:    ATP III Classification       Desirable:  < 200 mg/dL               Borderline High:  200 - 239 mg/dL  High:  > = 240 mg/dL         Passed - HDL in normal range and within 360 days    HDL  Date Value Ref Range Status  04/30/2018 50.20 >39.00 mg/dL Final         Passed - Triglycerides in normal range and within 360 days    Triglycerides  Date Value Ref Range Status  04/30/2018 96.0 0.0 - 149.0 mg/dL Final    Comment:    Normal:  <150 mg/dLBorderline High:  150 - 199 mg/dL         Passed - Patient is not pregnant      Passed - Valid encounter within last 12 months    Recent Outpatient Visits          3 months ago Need for influenza vaccination   [facility] -Madelin RearElam Jones, Thomas L, MD   6 months ago Penile discharge   [facility] -Madelin RearElam Jones, Thomas L, MD   2 years ago Essential hypertension   [facility] -Madelin RearElam Jones, Thomas L, MD

## 2018-10-30 MED ORDER — ATORVASTATIN CALCIUM 40 MG PO TABS: 40.0000 mg | ORAL_TABLET | Freq: Every day | ORAL | 1 refills | Status: DC

## 2019-01-27 ENCOUNTER — Telehealth: Payer: Self-pay | Admitting: Internal Medicine

## 2019-01-27 NOTE — Telephone Encounter (Signed)
Copied from CRM [id number]. Topic: General - Other >> Jan 27, 2019  4:14 PM Herby Abraham C wrote: Reason for CRM:  pt is requesting a refill for Metronidazole 250 MG. Pt didn't want to provide symptoms but states that he is having the same symptoms as before when he was treated for this. Showing pt was seen on 04/2018 for concern.    Pharmacy: [facility] [phone] - [place], [place] - [address] [phone] (Phone) [phone] (Fax)

## 2019-01-29 ENCOUNTER — Other Ambulatory Visit: Payer: Self-pay | Admitting: Internal Medicine

## 2019-01-29 DIAGNOSIS — R369 Urethral discharge, unspecified: Secondary | ICD-10-CM

## 2019-01-29 DIAGNOSIS — N481 Balanitis: Secondary | ICD-10-CM

## 2019-01-29 NOTE — Telephone Encounter (Signed)
Appt has been made for next Wednesday

## 2019-01-29 NOTE — Telephone Encounter (Signed)
Pt need to make f/u appt w/MD. Per last ov was suppose to return back in April. Called pt no answer LMOM will need appt for renewal../lmb

## 2019-02-02 ENCOUNTER — Other Ambulatory Visit: Payer: Self-pay

## 2019-02-02 MED ORDER — GLIMEPIRIDE 2 MG PO TABS: 2.0000 mg | ORAL_TABLET | Freq: Every day | ORAL | 1 refills | Status: DC

## 2019-02-03 ENCOUNTER — Encounter: Payer: Self-pay | Admitting: Internal Medicine

## 2019-02-03 ENCOUNTER — Ambulatory Visit (INDEPENDENT_AMBULATORY_CARE_PROVIDER_SITE_OTHER): Payer: 59 | Admitting: Internal Medicine

## 2019-02-03 ENCOUNTER — Other Ambulatory Visit: Payer: Self-pay

## 2019-02-03 ENCOUNTER — Other Ambulatory Visit (INDEPENDENT_AMBULATORY_CARE_PROVIDER_SITE_OTHER): Payer: 59

## 2019-02-03 VITALS — BP 118/84 | HR 99 | Temp 98.3°F | Resp 16 | Ht 70.0 in | Wt 244.0 lb

## 2019-02-03 DIAGNOSIS — R011 Cardiac murmur, unspecified: Secondary | ICD-10-CM

## 2019-02-03 DIAGNOSIS — I10 Essential (primary) hypertension: Secondary | ICD-10-CM

## 2019-02-03 DIAGNOSIS — E559 Vitamin D deficiency, unspecified: Secondary | ICD-10-CM | POA: Diagnosis not present

## 2019-02-03 DIAGNOSIS — N481 Balanitis: Secondary | ICD-10-CM | POA: Diagnosis not present

## 2019-02-03 DIAGNOSIS — E118 Type 2 diabetes mellitus with unspecified complications: Secondary | ICD-10-CM

## 2019-02-03 LAB — CBC WITH DIFFERENTIAL/PLATELET
Basophils Absolute: 0.1 10*3/uL (ref 0.0–0.1)
Basophils Relative: 0.9 % (ref 0.0–3.0)
Eosinophils Absolute: 0.1 10*3/uL (ref 0.0–0.7)
Eosinophils Relative: 1 % (ref 0.0–5.0)
HCT: 42.4 % (ref 39.0–52.0)
Hemoglobin: 14.5 g/dL (ref 13.0–17.0)
Lymphocytes Relative: 36.7 % (ref 12.0–46.0)
Lymphs Abs: 3.1 10*3/uL (ref 0.7–4.0)
MCHC: 34.1 g/dL (ref 30.0–36.0)
MCV: 84.9 fl (ref 78.0–100.0)
Monocytes Absolute: 0.6 10*3/uL (ref 0.1–1.0)
Monocytes Relative: 7.1 % (ref 3.0–12.0)
Neutro Abs: 4.7 10*3/uL (ref 1.4–7.7)
Neutrophils Relative %: 54.3 % (ref 43.0–77.0)
Platelets: 331 10*3/uL (ref 150.0–400.0)
RBC: 4.99 Mil/uL (ref 4.22–5.81)
RDW: 13.4 % (ref 11.5–15.5)
WBC: 8.6 10*3/uL (ref 4.0–10.5)

## 2019-02-03 LAB — BASIC METABOLIC PANEL
BUN: 15 mg/dL (ref 6–23)
CO2: 27 mEq/L (ref 19–32)
Calcium: 9.4 mg/dL (ref 8.4–10.5)
Chloride: 97 mEq/L (ref 96–112)
Creatinine, Ser: 1.04 mg/dL (ref 0.40–1.50)
GFR: 93.07 mL/min (ref >60.00)
Glucose, Bld: 338 mg/dL — ABNORMAL HIGH (ref 70–99)
Potassium: 3.7 mEq/L (ref 3.5–5.1)
Sodium: 134 mEq/L — ABNORMAL LOW (ref 135–145)

## 2019-02-03 LAB — POCT GLYCOSYLATED HEMOGLOBIN (HGB A1C): Hemoglobin A1C: 12.6 % — AB (ref 4.0–5.6)

## 2019-02-03 LAB — POCT GLUCOSE (DEVICE FOR HOME USE): Glucose Fasting, POC: 316 mg/dL — AB (ref 70–99)

## 2019-02-03 LAB — VITAMIN D 25 HYDROXY (VIT D DEFICIENCY, FRACTURES): VITD: 16.56 ng/mL — ABNORMAL LOW (ref 30.00–100.00)

## 2019-02-03 LAB — SEDIMENTATION RATE: Sed Rate: 44 mm/hr — ABNORMAL HIGH (ref 0–15)

## 2019-02-03 MED ORDER — DAPAGLIFLOZIN PROPANEDIOL 5 MG PO TABS: 5.0000 mg | ORAL_TABLET | Freq: Every day | ORAL | 0 refills | Status: DC

## 2019-02-03 MED ORDER — INSULIN DETEMIR 100 UNIT/ML FLEXPEN: 60.0000 [IU] | PEN_INJECTOR | Freq: Every day | SUBCUTANEOUS | 1 refills | Status: DC

## 2019-02-03 MED ORDER — KETOCONAZOLE 2 % EX CREA: 1.0000 "application " | TOPICAL_CREAM | Freq: Two times a day (BID) | CUTANEOUS | 2 refills | Status: DC

## 2019-02-03 MED ORDER — CHOLECALCIFEROL 1.25 MG (50000 UT) PO CAPS: 50000.0000 [IU] | ORAL_CAPSULE | ORAL | 0 refills | Status: DC

## 2019-02-03 NOTE — Patient Instructions (Signed)
Type 2 Diabetes Mellitus, Diagnosis, Adult Type 2 diabetes (type 2 diabetes mellitus) is a long-term (chronic) disease. In type 2 diabetes, one or both of these problems may be present:  The pancreas does not make enough of a hormone called insulin.  Cells in the body do not respond properly to insulin that the body makes (insulin resistance). Normally, insulin allows blood sugar (glucose) to enter cells in the body. The cells use glucose for energy. Insulin resistance or lack of insulin causes excess glucose to build up in the blood instead of going into cells. As a result, high blood glucose (hyperglycemia) develops. What increases the risk? The following factors may make you more likely to develop type 2 diabetes:  Having a family member with type 2 diabetes.  Being overweight or obese.  Having an inactive (sedentary) lifestyle.  Having been diagnosed with insulin resistance.  Having a history of prediabetes, gestational diabetes, or polycystic ovary syndrome (PCOS).  Being of American-Indian, African-American, Hispanic/Latino, or Asian/Pacific Islander descent. What are the signs or symptoms? In the early stage of this condition, you may not have symptoms. Symptoms develop slowly and may include:  Increased thirst (polydipsia).  Increased hunger(polyphagia).  Increased urination (polyuria).  Increased urination during the night (nocturia).  Unexplained weight loss.  Frequent infections that keep coming back (recurring).  Fatigue.  Weakness.  Vision changes, such as blurry vision.  Cuts or bruises that are slow to heal.  Tingling or numbness in the hands or feet.  Dark patches on the skin (acanthosis nigricans). How is this diagnosed? This condition is diagnosed based on your symptoms, your medical history, a physical exam, and your blood glucose level. Your blood glucose may be checked with one or more of the following blood tests:  A fasting blood glucose (FBG)  test. You will not be allowed to eat (you will fast) for 8 hours or longer before a blood sample is taken.  A random blood glucose test. This test checks blood glucose at any time of day regardless of when you ate.  An A1c (hemoglobin A1c) blood test. This test provides information about blood glucose control over the previous 2-3 months.  An oral glucose tolerance test (OGTT). This test measures your blood glucose at two times: ? After fasting. This is your baseline blood glucose level. ? Two hours after drinking a beverage that contains glucose. You may be diagnosed with type 2 diabetes if:  Your FBG level is 126 mg/dL (7.0 mmol/L) or higher.  Your random blood glucose level is 200 mg/dL (11.1 mmol/L) or higher.  Your A1c level is 6.5% or higher.  Your OGTT result is higher than 200 mg/dL (11.1 mmol/L). These blood tests may be repeated to confirm your diagnosis. How is this treated? Your treatment may be managed by a specialist called an endocrinologist. Type 2 diabetes may be treated by following instructions from your health care provider about:  Making diet and lifestyle changes. This may include: ? Following an individualized nutrition plan that is developed by a diet and nutrition specialist (registered dietitian). ? Exercising regularly. ? Finding ways to manage stress.  Checking your blood glucose level as often as told.  Taking diabetes medicines or insulin daily. This helps to keep your blood glucose levels in the healthy range. ? If you use insulin, you may need to adjust the dosage depending on how physically active you are and what foods you eat. Your health care provider will tell you how to adjust your dosage.    Taking medicines to help prevent complications from diabetes, such as: ? Aspirin. ? Medicine to lower cholesterol. ? Medicine to control blood pressure. Your health care provider will set individualized treatment goals for you. Your goals will be based on  your age, other medical conditions you have, and how you respond to diabetes treatment. Generally, the goal of treatment is to maintain the following blood glucose levels:  Before meals (preprandial): 80-130 mg/dL (4.4-7.2 mmol/L).  After meals (postprandial): below 180 mg/dL (10 mmol/L).  A1c level: less than 7%. Follow these instructions at home: Questions to ask your health care provider  Consider asking the following questions: ? Do I need to meet with a diabetes educator? ? Where can I find a support group for people with diabetes? ? What equipment will I need to manage my diabetes at home? ? What diabetes medicines do I need, and when should I take them? ? How often do I need to check my blood glucose? ? What number can I call if I have questions? ? When is my next appointment? General instructions  Take over-the-counter and prescription medicines only as told by your health care provider.  Keep all follow-up visits as told by your health care provider. This is important.  For more information about diabetes, visit: ? American Diabetes Association (ADA): www.diabetes.org ? American Association of Diabetes Educators (AADE): www.diabeteseducator.org Contact a health care provider if:  Your blood glucose is at or above 240 mg/dL (13.3 mmol/L) for 2 days in a row.  You have been sick or have had a fever for 2 days or longer, and you are not getting better.  You have any of the following problems for more than 6 hours: ? You cannot eat or drink. ? You have nausea and vomiting. ? You have diarrhea. Get help right away if:  Your blood glucose is lower than 54 mg/dL (3.0 mmol/L).  You become confused or you have trouble thinking clearly.  You have difficulty breathing.  You have moderate or large ketone levels in your urine. Summary  Type 2 diabetes (type 2 diabetes mellitus) is a long-term (chronic) disease. In type 2 diabetes, the pancreas does not make enough of a  hormone called insulin, or cells in the body do not respond properly to insulin that the body makes (insulin resistance).  This condition is treated by making diet and lifestyle changes and taking diabetes medicines or insulin.  Your health care provider will set individualized treatment goals for you. Your goals will be based on your age, other medical conditions you have, and how you respond to diabetes treatment.  Keep all follow-up visits as told by your health care provider. This is important. This information is not intended to replace advice given to you by your health care provider. Make sure you discuss any questions you have with your health care provider. Document Released: 09/02/2005 Document Revised: 04/03/2017 Document Reviewed: 10/06/2015 Elsevier Interactive Patient Education  2019 Elsevier Inc.  

## 2019-02-03 NOTE — Progress Notes (Signed)
Subjective:  Patient ID: Howard Calhoun, male    DOB: [birth year]  Age: 48 y.o. MRN: [id number]  CC: Hypertension and Diabetes   HPI Howard Calhoun presents for f/up - He tells me that his blood sugars have not been well controlled.  He is not compliant with his glycemic agents.  The last time that he used the basal insulin was about 4 days ago.  He is not willing to use Trulicity because it is an injection.  He complains of polys and a rash around the tip of his penis.  Outpatient Medications Prior to Visit  Medication Sig Dispense Refill  . atorvastatin (LIPITOR) 40 MG tablet Take 1 tablet (40 mg total) by mouth daily. 90 tablet 1  . Avanafil (STENDRA) 200 MG TABS Take 1 tablet by mouth daily as needed. 8 tablet 5  . Azilsartan-Chlorthalidone (EDARBYCLOR) 40-12.5 MG TABS Take 1 tablet by mouth daily. 90 tablet 1  . Blood Glucose Monitoring Suppl (ONETOUCH VERIO FLEX SYSTEM) w/Device KIT 1 Act by Does not apply route 3 (three) times daily with meals. 2 kit 0  . Continuous Blood Gluc Receiver (FREESTYLE LIBRE 14 DAY READER) DEVI 1 applicator by Does not apply route daily. 1 Device 0  . Continuous Blood Gluc Sensor (FREESTYLE LIBRE 14 DAY SENSOR) MISC 2 application by Does not apply route daily. 2 each 11  . glimepiride (AMARYL) 2 MG tablet Take 1 tablet (2 mg total) by mouth daily with breakfast. 30 tablet 1  . glucose blood (ONETOUCH VERIO) test strip Use TID 100 each 12  . Insulin Pen Needle 32G X 4 MM MISC Use to inject insulin DX E11.8 100 each 3  . omeprazole (PRILOSEC) 40 MG capsule Take 1 capsule (40 mg total) by mouth daily. 30 capsule 5  . Dulaglutide (TRULICITY) 1.49 FW/2.6VZ SOPN Inject 0.75 mg into the skin once a week. 4 pen 1  . Insulin Detemir (LEVEMIR FLEXTOUCH) 100 UNIT/ML Pen Inject 40 Units into the skin daily at 10 pm. 45 mL 3   No facility-administered medications prior to visit.     ROS Review of Systems  Constitutional: Negative.  Negative for diaphoresis,  fatigue and unexpected weight change.  HENT: Negative.   Eyes: Negative for visual disturbance.  Respiratory: Negative for cough, chest tightness, shortness of breath and wheezing.   Cardiovascular: Negative for chest pain, palpitations and leg swelling.  Gastrointestinal: Negative for abdominal pain, constipation, diarrhea, nausea and vomiting.  Endocrine: Positive for polydipsia, polyphagia and polyuria.  Genitourinary: Negative for difficulty urinating, discharge, dysuria, genital sores, penile pain, penile swelling, scrotal swelling and testicular pain.  Musculoskeletal: Negative for arthralgias and myalgias.  Skin: Positive for rash. Negative for color change.       He complains of a several week hx of asymptomatic rash at the base of the glans of the penis.   Neurological: Negative.  Negative for dizziness, weakness and light-headedness.  Hematological: Negative for adenopathy. Does not bruise/bleed easily.  Psychiatric/Behavioral: Negative.     Objective:  BP 118/84 (BP Location: Left Arm, Patient Position: Sitting, Cuff Size: Large)   Pulse 99   Temp 98.3 F (36.8 C) (Oral)   Resp 16   Ht '5\' 10"'$  (1.778 m)   Wt 244 lb (110.7 kg)   SpO2 97%   BMI 35.01 kg/m   BP Readings from Last 3 Encounters:  02/03/19 118/84  08/05/18 130/72  07/08/18 130/84    Wt Readings from Last 3 Encounters:  02/03/19 244 lb (  110.7 kg)  08/05/18 245 lb 2 oz (111.2 kg)  07/08/18 248 lb (112.5 kg)    Physical Exam Vitals signs reviewed.  Constitutional:      Appearance: He is obese. He is not ill-appearing or diaphoretic.  HENT:     Nose: Nose normal.     Mouth/Throat:     Mouth: Mucous membranes are moist.     Pharynx: No posterior oropharyngeal erythema.  Eyes:     General: No scleral icterus.    Conjunctiva/sclera: Conjunctivae normal.  Cardiovascular:     Rate and Rhythm: Normal rate and regular rhythm.     Heart sounds: Murmur present. Systolic murmur present with a grade of 2/6.  No diastolic murmur. No gallop.      Comments: 2/6 SEM over LLSB Pulmonary:     Effort: Pulmonary effort is normal. No respiratory distress.     Breath sounds: Normal breath sounds. No stridor. No wheezing, rhonchi or rales.  Abdominal:     General: Abdomen is flat. There is no distension.     Palpations: There is no hepatomegaly, splenomegaly or mass.     Tenderness: There is no abdominal tenderness. There is no guarding.  Genitourinary:    Pubic Area: Rash present.     Penis: Circumcised. No discharge, swelling or lesions.      Scrotum/Testes: Normal.     Comments: Just proximal to the corona of the glans at the distalmost aspect of the neck of the glans there is a thin rim of pale, macerated skin.  There are no vesicles, ulcers, exudates, erythema, excoriations, or penile discharge. Musculoskeletal:     Right lower leg: No edema.     Left lower leg: No edema.  Skin:    General: Skin is warm.     Coloration: Skin is not pale.     Findings: Rash present. No lesion.  Neurological:     General: No focal deficit present.  Psychiatric:        Mood and Affect: Mood normal.        Behavior: Behavior normal.     Lab Results  Component Value Date   WBC 8.6 02/03/2019   HGB 14.5 02/03/2019   HCT 42.4 02/03/2019   PLT 331.0 02/03/2019   GLUCOSE 338 (H) 02/03/2019   CHOL 170 04/30/2018   TRIG 96.0 04/30/2018   HDL 50.20 04/30/2018   LDLCALC 100 (H) 04/30/2018   ALT 13 04/30/2018   AST 15 04/30/2018   NA 134 (L) 02/03/2019   K 3.7 02/03/2019   CL 97 02/03/2019   CREATININE 1.04 02/03/2019   BUN 15 02/03/2019   CO2 27 02/03/2019   TSH 1.30 04/30/2018   PSA 0.61 04/30/2018   HGBA1C 12.6 (A) 02/03/2019   MICROALBUR 3.6 (H) 04/30/2018    No results found.  Assessment & Plan:   Howard Calhoun was seen today for hypertension and diabetes.  Diagnoses and all orders for this visit:  Murmur, cardiac- He has a normal white cell count but a slightly elevated sed rate.  I have asked him  to undergo an echocardiogram to see if there is concern for endocarditis, valvular disease, or wall motion abnormality. -     ECHOCARDIOGRAM COMPLETE; Future -     CBC with Differential/Platelet; Future -     Sedimentation rate; Future  Type 2 diabetes mellitus with complication, without long-term current use of insulin ([place])- His A1c is up to 12.6%.  His blood sugar is not adequately well controlled and he  has dilutional hyponatremia.  I have asked him to be compliant with the basal insulin and to increase his dose to 60 units once a day.  He agrees to continue taking the sulfonylurea.  He will not take metformin due to history of GI distress.  I will also add on an SGLT2 inhibitor. -     Basic metabolic panel; Future -     POCT glycosylated hemoglobin (Hb A1C) -     POCT Glucose (Device for Home Use) -     Ambulatory referral to Endocrinology -     Amb Referral to Nutrition and Diabetic E -     Consult to [place] [facility] -     C-peptide; Future -     dapagliflozin propanediol (FARXIGA) 5 MG TABS tablet; Take 5 mg by mouth daily. -     Ambulatory referral to Ophthalmology -     Insulin Detemir (LEVEMIR FLEXTOUCH) 100 UNIT/ML Pen; Inject 60 Units into the skin daily at 10 pm.  Essential hypertension- His blood pressure is adequately well controlled. -     Basic metabolic panel; Future  Balanitis circinata -     ketoconazole (NIZORAL) 2 % cream; Apply 1 application topically 2 (two) times daily.  Vitamin D deficiency disease -     VITAMIN D 25 Hydroxy (Vit-D Deficiency, Fractures); Future -     Cholecalciferol 1.25 MG (50000 UT) capsule; Take 1 capsule (50,000 Units total) by mouth once a week.   I have discontinued Jeneen Rinks T. Savas's Dulaglutide. I have also changed his Insulin Detemir. Additionally, I am having him start on ketoconazole, dapagliflozin propanediol, and Cholecalciferol. Lastly, I am having him maintain his [place], glucose blood, FreeStyle Libre 14  Day Reader, FreeStyle Libre 14 Day Sensor, Insulin Pen Needle, Avanafil, Azilsartan-Chlorthalidone, omeprazole, atorvastatin, and glimepiride.  Meds ordered this encounter  Medications  . ketoconazole (NIZORAL) 2 % cream    Sig: Apply 1 application topically 2 (two) times daily.    Dispense:  60 g    Refill:  2  . dapagliflozin propanediol (FARXIGA) 5 MG TABS tablet    Sig: Take 5 mg by mouth daily.    Dispense:  70 tablet    Refill:  0  . Cholecalciferol 1.25 MG (50000 UT) capsule    Sig: Take 1 capsule (50,000 Units total) by mouth once a week.    Dispense:  12 capsule    Refill:  0  . Insulin Detemir (LEVEMIR FLEXTOUCH) 100 UNIT/ML Pen    Sig: Inject 60 Units into the skin daily at 10 pm.    Dispense:  45 mL    Refill:  1     Follow-up: Return in about 3 months (around 05/06/2019).  Scarlette Calico, MD

## 2019-02-04 ENCOUNTER — Other Ambulatory Visit: Payer: Self-pay | Admitting: *Deleted

## 2019-02-04 NOTE — Patient Outreach (Addendum)
[facility]) Care Management  02/04/2019  Howard Calhoun 01-25-1971 [id number]   Telephone Screen  Referral Date:02/04/19 Referral Source: MD referral Dr Howard Calhoun, [facility] senior care   Associated Diagnoses :  Type 2 Diabetes mellitus with complication, without long term current use of insulin  Insurance united health care medicare    Outreach attempt # 1 successful at his home/mobile number  Patient is able to verify HIPAA Reviewed and addressed referral to Howard Calhoun with patient Howard Calhoun lost phone connection and returned a call back to Howard Calhoun   Howard Calhoun informs [facility] RN CM "I need to get better at managing." He states he is knowledgeable  but "I am not consistent" with his medical treatment. He reports not always taking his medications as ordered. He reports his coverage deductible often is a barrier   Social: Howard Calhoun is a 48 year old married male. 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 He reports being independent with all his care needs including transportation to medical appointments.  He recently loss his mother related to lung cancer and therefore knows he should be more consistent in his home medical care. He reports needing to take the time to be more consistent     Conditions: Type 2 Diabetes mellitus with complication, without long term current use of insulin (Hg A1c =12.6), HTN, GERD, erectile dysfunction due to arterial insufficiency, esophageal dysphagia, HLD, vitamin D deficiency, cardiac murmur, balanitis circinata, obese (BMI 35.01 wt 244 lbs HT 5'10")  DME: have 2 cbg monitors -  one touch verio flex, Freestyle Libre 14 day reader   Medications: He reports he is okay with the cost of his medication except the most recent medication Dr Howard Calhoun started him on. As of 02/03/19, he is receiving Fargixa samples from Dr Howard Calhoun office for 2 months. He reports contacting [place] [facility] about the cost after the 2  months of samples and he states he would not be able to afford the cost after 2 months.He reports that Howard Calhoun is "not covered" He states he has voiced this concern to the MD and has questioned if he should start Fargixa if he would have to discontinue it in 2 months. He is agreeable to start another medication after the 2 month samples  He reports he is inconsistent and voices he will now make all attempts to be more consistent to see if the new tx plan is a success He reports not always taking his medications as ordered. He reports his coverage deductible often is a barrier  Also on Levemir flex touch pen.60 units qd at 10 pm  He is not willing to use Trulicity because it is an injection.  He does not like Metformin related GI distress He reports he prefers oral agents not injectables  Appointments: Saw Dr Howard Calhoun on 02/03/19  Referred to endocrinology, nutrition and diabetic education and ophthalmology by Dr Howard Calhoun on 02/03/19   Advance Directives:Denies need for assist with advance directives    Consent: [facility] RN CM reviewed [facility] services with patient. Patient gave verbal consent for services [facility] telephonic RN CM and [facility] health coach. Howard Calhoun and RN CM discussed [facility] community RN and [facility] health coach services in detail. Howard Calhoun states he would like to try [facility] health coach services but if he does not find them beneficial he will inform [name]   Plan: [facility] RN CM will refer Howard Calhoun to [facility] Health coach for  further evaluation/assessment of care needs, education and management of chronic conditions of Diabetes and HTN  [facility] RN CM will close telephonic case at this time  Pt encouraged to return a call to [facility] CM prn  [facility] CM sent a successful outreach letter as discussed with [facility] brochure enclosed for review  Letter to Dr Howard BarreJones to indicate referral to [facility] health coach services  Route note to Dr Howard Calhoun   Howard Calhoun L. Howard PennerGibbs, RN, BSN, CCM [facility] Telephonic Care Management Care  Coordinator Office number [phone] Mobile number [phone]  Main [facility] number [phone] Fax number [phone]

## 2019-02-05 LAB — C-PEPTIDE: C-Peptide: 4.01 ng/mL — ABNORMAL HIGH (ref 0.80–3.85)

## 2019-02-10 ENCOUNTER — Other Ambulatory Visit: Payer: Self-pay

## 2019-02-19 ENCOUNTER — Encounter: Payer: Self-pay | Admitting: Endocrinology

## 2019-03-04 ENCOUNTER — Telehealth (HOSPITAL_COMMUNITY): Payer: Self-pay

## 2019-03-04 ENCOUNTER — Other Ambulatory Visit: Payer: Self-pay

## 2019-03-04 NOTE — Patient Outreach (Signed)
[facility]) Care Management  03/04/2019   Howard Calhoun [birth year] [id number]      Outreach attempt # 1 to the patient for initial assessment.  HIPAA verified.  The patient was able to complete the full assessment.  Social: The patient is a 48 year old married male. His wife Howard Calhoun is very supportive. He reports being independent with his ADLS/IADLS .  He has transportation medical appointments. The durable medical equipment in the home includes: eyeglasses, and One touch glucometer.  Conditions: Per chart review and conversation with the patient his conditions consist of:  Type 2 Diabetes mellitus with complication, without long term current use of insulin, HTN, GERD, erectile dysfunction due to arterial insufficiency, esophageal dysphagia, HLD, vitamin D deficiency, and cardiac murmur. The patient states that he checks his blood sugar at least once a day sometimes twice.  He states that he does not exercise.  He states he know that he need to work on his diet and his wife has started trying to change things in the home.  Medications: Per conversation with the patient he is on seven medications.  He manages his medications.  He does not express any problems paying for his medications. The patient states that he takes all of his pills but is not consistent taking his insulin.  He states that he will forget or if he goes out of town will forget to bring it with him.  He states that shot does not hurt but it is a "mental" thing for him.  Appointments: The patient just had an appointment with Dr Howard Calhoun 02/03/19 and is waiting on an appointment with the endocrinologist, ophthalmology and diabetic educator.  Advanced Directives:  The patient states that he does not have an advanced directive but would like for me to send him the information.   Current Medications:  Current Outpatient Medications  Medication Sig Dispense Refill  . atorvastatin (LIPITOR) 40 MG tablet Take 1  tablet (40 mg total) by mouth daily. 90 tablet 1  . Azilsartan-Chlorthalidone (EDARBYCLOR) 40-12.5 MG TABS Take 1 tablet by mouth daily. 90 tablet 1  . Blood Glucose Monitoring Suppl (ONETOUCH VERIO FLEX SYSTEM) w/Device KIT 1 Act by Does not apply route 3 (three) times daily with meals. 2 kit 0  . Cholecalciferol 1.25 MG (50000 UT) capsule Take 1 capsule (50,000 Units total) by mouth once a week. 12 capsule 0  . dapagliflozin propanediol (FARXIGA) 5 MG TABS tablet Take 5 mg by mouth daily. 70 tablet 0  . glimepiride (AMARYL) 2 MG tablet Take 1 tablet (2 mg total) by mouth daily with breakfast. 30 tablet 1  . glucose blood (ONETOUCH VERIO) test strip Use TID 100 each 12  . Insulin Detemir (LEVEMIR FLEXTOUCH) 100 UNIT/ML Pen Inject 60 Units into the skin daily at 10 pm. 45 mL 1  . Insulin Pen Needle 32G X 4 MM MISC Use to inject insulin DX E11.8 100 each 3  . ketoconazole (NIZORAL) 2 % cream Apply 1 application topically 2 (two) times daily. 60 g 2  . omeprazole (PRILOSEC) 40 MG capsule Take 1 capsule (40 mg total) by mouth daily. 30 capsule 5  . Avanafil (STENDRA) 200 MG TABS Take 1 tablet by mouth daily as needed. (Patient not taking: Reported on 03/04/2019) 8 tablet 5  . Continuous Blood Gluc Receiver (FREESTYLE LIBRE 14 DAY READER) DEVI 1 applicator by Does not apply route daily. (Patient not taking: Reported on 03/04/2019) 1 Device 0  . Continuous Blood Gluc  Sensor (FREESTYLE LIBRE 14 DAY SENSOR) MISC 2 application by Does not apply route daily. (Patient not taking: Reported on 03/04/2019) 2 each 11   No current facility-administered medications for this visit.     Functional Status:  In your present state of health, do you have any difficulty performing the following activities: 03/04/2019  Hearing? N  Vision? N  Difficulty concentrating or making decisions? N  Walking or climbing stairs? N  Dressing or bathing? N  Doing errands, shopping? N  Some recent data might be hidden     Fall/Depression Screening: Fall Risk  03/04/2019  Falls in the past year? 0   PHQ 2/9 Scores 03/04/2019 02/04/2019  PHQ - 2 Score 0 0    Assessment: Patient will benefit from health coach outreach for disease management and support.  THN CM Care Plan Problem One     Most Recent Value  Care Plan Problem One  Knowlegde deficit related to diease management as evidenced by A1c of 12.6  Role Documenting the Problem One  [place] for Problem One  Active  THN Long Term Goal   In 90 days the patient will verbalize that he has lowered his a1c by 1-2 points  [facility] Long Term Goal Start Date  03/04/19  Interventions for Problem One Long Term Goal  Discussed FBS, high a1c, diet making healthier choices, exercise and medication adherences     Plan: [place] will provide ongoing education for patient on diabetes through phone calls and sending printed information to patient for further discussion.  RN Health Coach will send Booklet on diabetes.  RN Health Coach will send initial barriers letter, assessment, and care plan to primary care physician.  [place] will send Consent form.  RN Health Coach will contact patient in the month of July and patient agrees to next outreach. RN Health Coach will make sure the patient has received the educational material.  Howard Arms RN, BSN, [place] Direct Dial:  [phone]  Fax: [phone]

## 2019-03-04 NOTE — Telephone Encounter (Signed)

## 2019-03-05 ENCOUNTER — Other Ambulatory Visit: Payer: Self-pay

## 2019-03-05 ENCOUNTER — Ambulatory Visit (HOSPITAL_COMMUNITY): Payer: 59 | Attending: Cardiology

## 2019-03-05 DIAGNOSIS — R011 Cardiac murmur, unspecified: Secondary | ICD-10-CM | POA: Insufficient documentation

## 2019-03-08 ENCOUNTER — Encounter: Payer: Self-pay | Admitting: Internal Medicine

## 2019-03-08 ENCOUNTER — Other Ambulatory Visit: Payer: Self-pay | Admitting: Internal Medicine

## 2019-03-08 DIAGNOSIS — R011 Cardiac murmur, unspecified: Secondary | ICD-10-CM

## 2019-04-01 ENCOUNTER — Telehealth: Payer: Self-pay | Admitting: Cardiovascular Disease

## 2019-04-01 NOTE — Telephone Encounter (Signed)
New Message ° ° °Unable to leave message due to mailbox being full °Called to confirm appt and answer covid questions  °

## 2019-04-02 ENCOUNTER — Ambulatory Visit: Payer: 59 | Admitting: Cardiovascular Disease

## 2019-04-02 ENCOUNTER — Other Ambulatory Visit: Payer: Self-pay

## 2019-04-02 NOTE — Patient Outreach (Signed)
[facility]) Care Management  04/02/2019   Howard Calhoun [birth year] [id number]  Subjective: Successful outreach to the patient.  Two patient identifiers obtained. He states that he received his information that I had sent and he reviewed it that week. The patient states that he has been doing good.  He denies any pain or falls.  He states that his blood sugars are ranging between 180-190.  He states that it is better than what his previous numbers were looking like.  He is taking his medications as he should.  He states he has started to make some small changes in his diet.  Discussed with him how and when he should check his blood sugars.  In the morning fasting no food or drink and two hours after eating.  He verbalized understanding.  Current Medications:  Current Outpatient Medications  Medication Sig Dispense Refill  . atorvastatin (LIPITOR) 40 MG tablet Take 1 tablet (40 mg total) by mouth daily. 90 tablet 1  . Azilsartan-Chlorthalidone (EDARBYCLOR) 40-12.5 MG TABS Take 1 tablet by mouth daily. 90 tablet 1  . Blood Glucose Monitoring Suppl (ONETOUCH VERIO FLEX SYSTEM) w/Device KIT 1 Act by Does not apply route 3 (three) times daily with meals. 2 kit 0  . Cholecalciferol 1.25 MG (50000 UT) capsule Take 1 capsule (50,000 Units total) by mouth once a week. 12 capsule 0  . dapagliflozin propanediol (FARXIGA) 5 MG TABS tablet Take 5 mg by mouth daily. 70 tablet 0  . glimepiride (AMARYL) 2 MG tablet Take 1 tablet (2 mg total) by mouth daily with breakfast. 30 tablet 1  . glucose blood (ONETOUCH VERIO) test strip Use TID 100 each 12  . Insulin Detemir (LEVEMIR FLEXTOUCH) 100 UNIT/ML Pen Inject 60 Units into the skin daily at 10 pm. 45 mL 1  . Insulin Pen Needle 32G X 4 MM MISC Use to inject insulin DX E11.8 100 each 3  . ketoconazole (NIZORAL) 2 % cream Apply 1 application topically 2 (two) times daily. 60 g 2  . omeprazole (PRILOSEC) 40 MG capsule Take 1 capsule (40 mg  total) by mouth daily. 30 capsule 5  . Avanafil (STENDRA) 200 MG TABS Take 1 tablet by mouth daily as needed. (Patient not taking: Reported on 03/04/2019) 8 tablet 5  . Continuous Blood Gluc Receiver (FREESTYLE LIBRE 14 DAY READER) DEVI 1 applicator by Does not apply route daily. (Patient not taking: Reported on 03/04/2019) 1 Device 0  . Continuous Blood Gluc Sensor (FREESTYLE LIBRE 14 DAY SENSOR) MISC 2 application by Does not apply route daily. (Patient not taking: Reported on 03/04/2019) 2 each 11   No current facility-administered medications for this visit.     Functional Status:  In your present state of health, do you have any difficulty performing the following activities: 03/04/2019  Hearing? N  Vision? N  Difficulty concentrating or making decisions? N  Walking or climbing stairs? N  Dressing or bathing? N  Doing errands, shopping? N  Some recent data might be hidden    Fall/Depression Screening: Fall Risk  04/02/2019 03/04/2019  Falls in the past year? 0 0   PHQ 2/9 Scores 03/04/2019 02/04/2019  PHQ - 2 Score 0 0    Assessment: Patient will continue to benefit from health coach outreach for disease management and support. THN CM Care Plan Problem One     Most Recent Value  THN Long Term Goal   In 30 days the patient will verbalize that he has lowered his  a1c by 1-2 points  THN Long Term Goal Start Date  04/02/19  Interventions for Problem One Long Term Goal  Reviewed blood sugars, reviewed food and drink choices and dicussed healthier options, encouraged medication adherenc     Plan: [place] will contact patient in the month of August and patient agrees to next outreach.   Lazaro Arms RN, BSN, [place] Direct Dial:  [phone]  Fax: [phone]

## 2019-04-08 ENCOUNTER — Other Ambulatory Visit: Payer: Self-pay | Admitting: Internal Medicine

## 2019-04-08 NOTE — Telephone Encounter (Signed)
Pt last seen 07/07/2018 no f/u scheduled please advise

## 2019-04-26 ENCOUNTER — Other Ambulatory Visit: Payer: Self-pay | Admitting: Internal Medicine

## 2019-04-26 DIAGNOSIS — E118 Type 2 diabetes mellitus with unspecified complications: Secondary | ICD-10-CM

## 2019-04-26 DIAGNOSIS — I10 Essential (primary) hypertension: Secondary | ICD-10-CM

## 2019-04-27 ENCOUNTER — Ambulatory Visit (INDEPENDENT_AMBULATORY_CARE_PROVIDER_SITE_OTHER): Payer: 59 | Admitting: Cardiovascular Disease

## 2019-04-27 ENCOUNTER — Encounter: Payer: Self-pay | Admitting: Cardiovascular Disease

## 2019-04-27 ENCOUNTER — Other Ambulatory Visit: Payer: Self-pay

## 2019-04-27 VITALS — BP 100/70 | HR 90 | Temp 98.3°F | Ht 71.0 in | Wt 244.0 lb

## 2019-04-27 DIAGNOSIS — R011 Cardiac murmur, unspecified: Secondary | ICD-10-CM

## 2019-04-27 DIAGNOSIS — I10 Essential (primary) hypertension: Secondary | ICD-10-CM | POA: Diagnosis not present

## 2019-04-27 DIAGNOSIS — I422 Other hypertrophic cardiomyopathy: Secondary | ICD-10-CM

## 2019-04-27 DIAGNOSIS — E785 Hyperlipidemia, unspecified: Secondary | ICD-10-CM

## 2019-04-27 MED ORDER — METOPROLOL SUCCINATE ER 50 MG PO TB24: 50.0000 mg | ORAL_TABLET | Freq: Every day | ORAL | 3 refills | Status: DC

## 2019-04-27 NOTE — Assessment & Plan Note (Signed)
History of hyperlipidemia on statin therapy with lipid profile performed 04/30/2018 revealing total cholesterol 170, LDL 100 and HDL 50.

## 2019-04-27 NOTE — Progress Notes (Signed)
04/27/2019 Howard Calhoun   Feb 07, 1971  [id number]  Primary Physician Janith Lima, MD Primary Cardiologist: Lorretta Harp MD Lupe Carney, [place]  HPI:  Howard Calhoun is a 48 y.o. 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 Cathlean Cower for cardiovascular valuation because of an auscultated murmur.  His risk factors include treated diabetes, hypertension and hyperlipidemia.  Both his mother and father have ischemic heart disease and have had stents.  He is never had a heart attack or stroke.  He does get somewhat winded when playing with his 3 children.  His primary care physician auscultated a murmur and obtain a 2D echo cardiogram 6-19/20 revealing hyperdynamic left ventricle with asymmetric septal thickening and HOCM physiology with a gradient of 3.8 m/s.   Current Meds  Medication Sig  . atorvastatin (LIPITOR) 40 MG tablet Take 1 tablet (40 mg total) by mouth daily.  . Blood Glucose Monitoring Suppl (ONETOUCH VERIO FLEX SYSTEM) w/Device KIT 1 Act by Does not apply route 3 (three) times daily with meals.  . Cholecalciferol 1.25 MG (50000 UT) capsule Take 1 capsule (50,000 Units total) by mouth once a week.  . Continuous Blood Gluc Receiver (FREESTYLE LIBRE 14 DAY READER) DEVI 1 applicator by Does not apply route daily.  . Continuous Blood Gluc Sensor (FREESTYLE LIBRE 14 DAY SENSOR) MISC 2 application by Does not apply route daily.  . dapagliflozin propanediol (FARXIGA) 5 MG TABS tablet Take 5 mg by mouth daily.  Marland Kitchen EDARBYCLOR 40-12.5 MG TABS TAKE ONE TABLET BY MOUTH DAILY   . glimepiride (AMARYL) 2 MG tablet TAKE 1 TABLET(2 MG) BY MOUTH DAILY WITH BREAKFAST  . glucose blood (ONETOUCH VERIO) test strip Use TID  . Insulin Detemir (LEVEMIR FLEXTOUCH) 100 UNIT/ML Pen Inject 60 Units into the skin daily at 10 pm.  . Insulin Pen Needle 32G X 4 MM MISC Use to inject  insulin DX E11.8  . omeprazole (PRILOSEC) 40 MG capsule Take 1 capsule (40 mg total) by mouth daily.  . [DISCONTINUED] Avanafil (STENDRA) 200 MG TABS Take 1 tablet by mouth daily as needed.  . [DISCONTINUED] ketoconazole (NIZORAL) 2 % cream Apply 1 application topically 2 (two) times daily.     Allergies  Allergen Reactions  . Metformin And Related Diarrhea    Social History   Socioeconomic History  . Marital status: Married    Spouse name: Not on file  . Number of children: 3  . Years of education: Not on file  . Highest education level: Not on file  Occupational History  . Occupation: Retail buyer  Social Needs  . Financial resource strain: Not on file  . Food insecurity    Worry: Not on file    Inability: Not on file  . Transportation needs    Medical: Not on file    Non-medical: Not on file  Tobacco Use  . Smoking status: Never Smoker  . Smokeless tobacco: Never Used  Substance and Sexual Activity  . Alcohol use: No    Comment: 1-2 a month  . Drug use: No  . Sexual activity: Yes    Birth control/protection: None  Lifestyle  . Physical activity    Days per week: Not on file    Minutes per session: Not on file  . Stress: Not on file  Relationships  . Social Herbalist on phone:  Not on file    Gets together: Not on file    Attends religious service: Not on file    Active member of club or organization: Not on file    Attends meetings of clubs or organizations: Not on file    Relationship status: Not on file  . Intimate partner violence    Fear of current or ex partner: Not on file    Emotionally abused: Not on file    Physically abused: Not on file    Forced sexual activity: Not on file  Other Topics Concern  . Not on file  Social History Narrative  . Not on file     Review of Systems: General: negative for chills, fever, night sweats or weight changes.  Cardiovascular: negative for chest pain, dyspnea on exertion, edema, orthopnea,  palpitations, paroxysmal nocturnal dyspnea or shortness of breath Dermatological: negative for rash Respiratory: negative for cough or wheezing Urologic: negative for hematuria Abdominal: negative for nausea, vomiting, diarrhea, bright red blood per rectum, melena, or hematemesis Neurologic: negative for visual changes, syncope, or dizziness All other systems reviewed and are otherwise negative except as noted above.    Blood pressure 100/70, pulse 90, temperature 98.3 F (36.8 C), height 5' 11" (1.803 m), weight 244 lb (110.7 kg).  General appearance: alert and no distress Neck: no adenopathy, no carotid bruit, no JVD, supple, symmetrical, trachea midline and thyroid not enlarged, symmetric, no tenderness/mass/nodules Lungs: clear to auscultation bilaterally Heart: Apical systolic murmur Extremities: extremities normal, atraumatic, no cyanosis or edema Pulses: 2+ and symmetric Skin: Skin color, texture, turgor normal. No rashes or lesions Neurologic: Alert and oriented X 3, normal strength and tone. Normal symmetric reflexes. Normal coordination and gait  EKG sinus rhythm at 90 with out ST or T wave changes.  I personally reviewed this EKG.  ASSESSMENT AND PLAN:   Essential hypertension History of essential hypertension with blood pressure measured today at 100/70.  He is on E. Darby chlor 40/12.5.  Because of his echocardiographic diagnosis of hypertrophic cardiomyopathy I decided to stop his current antihypertensive medications and begin him on beta-blockade.  Hyperlipidemia with target LDL less than 100 History of hyperlipidemia on statin therapy with lipid profile performed 04/30/2018 revealing total cholesterol 170, LDL 100 and HDL 50.  Murmur, cardiac Diagnosis of cardiac murmur with 2D echo performed 03/05/2019 revealing hypertrophic cardiomyopathy.  His septum measures 1.7 cm.  He does have systolic anterior motion of the mitral valve with a peak velocity of 3.8 m/s consistent  with HOCM physiology.  Based on this and going to change his medications to a beta-blocker and titrate him to heart rate in the 60 range.  He will need annual 2D echocardiography to follow this as well as genetic testing.      Lorretta Harp MD FACP,FACC,FAHA, [facility] 04/27/2019 2:44 PM

## 2019-04-27 NOTE — Assessment & Plan Note (Addendum)
History of essential hypertension with blood pressure measured today at 100/70.  He is on E. Darby chlor 40/12.5.  Because of his echocardiographic diagnosis of hypertrophic cardiomyopathy I decided to stop his current antihypertensive medications and begin him on beta-blockade.

## 2019-04-27 NOTE — Patient Instructions (Addendum)
Medication Instructions:  Your physician has recommended you make the following change in your medication:   STOP TAKING YOUR  EDARBYCLOR  START METOPROLOL SUCCINATE (TOPROL XL) 50 MG, ONE TABLET BY MOUTH DAILY  If you need a refill on your cardiac medications before your next appointment, please call your pharmacy.   Lab work: NONE If you have labs (blood work) drawn today and your tests are completely normal, you will receive your results only by: Marland Kitchen MyChart Message (if you have MyChart) OR . A paper copy in the mail If you have any lab test that is abnormal or we need to change your treatment, we will call you to review the results.  Testing/Procedures: Your physician has requested that you have an echocardiogram. Echocardiography is a painless test that uses sound waves to create images of your heart. It provides your doctor with information about the size and shape of your heart and how well your heart's chambers and valves are working. This procedure takes approximately one hour. There are no restrictions for this procedure. LOCATION: [facility]: [place], [place], [place] [address] TO BE SCHEDULED IN 12 MONTHS  Follow-Up: At [facility], you and your health needs are our priority.  As part of our continuing mission to provide you with exceptional heart care, we have created designated Provider Care Teams.  These Care Teams include your primary Cardiologist (physician) and Advanced Practice Providers (APPs -  Physician Assistants and Nurse Practitioners) who all work together to provide you with the care you need, when you need it. . You will need a follow up appointment in 3 months WITH DR. Gwenlyn Found.  Please call our office 2 months in advance to schedule this appointment.   .   Any Other Special Instructions Will Be Listed Below (If Applicable). KEEP A BLOOD PRESSURE LOG FOR 30 DAYS THEN FOLLOW UP WITH A CLINICAL PHARMACIST IN THE HYPERTENSION CLINIC. YOU  WILL NEED AN APPOINTMENT.  AMBULATORY REFERRAL TO GENETICS. DR. Lattie Corns

## 2019-04-27 NOTE — Assessment & Plan Note (Signed)
Diagnosis of cardiac murmur with 2D echo performed 03/05/2019 revealing hypertrophic cardiomyopathy.  His septum measures 1.7 cm.  He does have systolic anterior motion of the mitral valve with a peak velocity of 3.8 m/s consistent with HOCM physiology.  Based on this and going to change his medications to a beta-blocker and titrate him to heart rate in the 60 range.  He will need annual 2D echocardiography to follow this as well as genetic testing.

## 2019-05-04 ENCOUNTER — Other Ambulatory Visit: Payer: Self-pay

## 2019-05-04 NOTE — Patient Outreach (Signed)
[facility]) Care Management  05/04/2019  Howard Calhoun [birth year] [id number]    1st unsuccessful letter to the patient for initial assessment.  No answer.  HIPAA compliant voicemail left with contact information.  Plan: RN Health Coach will send letter.  RN Health Coach will make an outreach attempt to the patient within four business days.  Lazaro Arms RN, BSN, [place] Direct Dial:  [phone]  Fax: [phone]

## 2019-05-07 ENCOUNTER — Other Ambulatory Visit: Payer: Self-pay

## 2019-05-07 NOTE — Patient Outreach (Signed)
[place] [facility]) Care Management  05/07/2019  Howard Calhoun 10-03-1970 [id number]    2nd unsuccessful outreach to the patient for assessment.  No answer.  Unable to leave a message because the mailbox is full.  Plan: RN Health Coach will make outreach attempt to the patient within thirty business  days.  Lazaro Arms RN, BSN, [place] Direct Dial:  [phone]  Fax: [phone]

## 2019-05-18 ENCOUNTER — Ambulatory Visit: Payer: Self-pay

## 2019-05-21 ENCOUNTER — Other Ambulatory Visit: Payer: Self-pay

## 2019-05-21 NOTE — Patient Outreach (Signed)
[place] [facility]) Care Management  05/21/2019  QUAVION BOULE 1971-06-30 [id number]    3rd outreach to the patient.  Patient answered the phone and states that he had a client walking in the door and would like for me to call him back.  Plan: RN Health Coach will make outreach attempt to the patient within thirty business  Days.  Lazaro Arms RN, BSN, [place] Direct Dial:  [phone]  Fax: [phone]

## 2019-05-25 ENCOUNTER — Other Ambulatory Visit: Payer: Self-pay

## 2019-05-25 NOTE — Patient Outreach (Signed)
[place] [facility]) Care Management  05/25/2019   Howard Calhoun 1971/05/10 [id number]  Subjective:  Successful outreach to the patient.  Two patient identifiers given.  The patient states that he is doing well.  He denies any pain or falls.  He does state that he had been having some swelling in his legs and his doctor switched his blood pressure medication to Metoprolol 50 mg.  He states that his blood sugars have remained around 170.  He is still having issues checking his blood sugars and he may skip checking a couple of days.  He reports that his wife works with [facility] and enrolled him in a program for diabetes.   He states that he will call me and let me know if he will continue his diabetes education with me. He has received the Dexcom continuous meter that he is going to start using.  He knows that checking his blood sugars and taking his medications regularly are important for change. We did discuss the importance of diet and how important it is and how it affects his blood sugars.  He verbalized understanding.   Current Medications:  Current Outpatient Medications  Medication Sig Dispense Refill  . atorvastatin (LIPITOR) 40 MG tablet Take 1 tablet (40 mg total) by mouth daily. 90 tablet 1  . Blood Glucose Monitoring Suppl (ONETOUCH VERIO FLEX SYSTEM) w/Device KIT 1 Act by Does not apply route 3 (three) times daily with meals. 2 kit 0  . Cholecalciferol 1.25 MG (50000 UT) capsule Take 1 capsule (50,000 Units total) by mouth once a week. 12 capsule 0  . dapagliflozin propanediol (FARXIGA) 5 MG TABS tablet Take 5 mg by mouth daily. 70 tablet 0  . EDARBYCLOR 40-12.5 MG TABS TAKE ONE TABLET BY MOUTH DAILY  30 tablet 5  . glimepiride (AMARYL) 2 MG tablet TAKE 1 TABLET(2 MG) BY MOUTH DAILY WITH BREAKFAST 30 tablet 0  . glucose blood (ONETOUCH VERIO) test strip Use TID 100 each 12  . Insulin Detemir (LEVEMIR FLEXTOUCH) 100 UNIT/ML Pen Inject 60 Units into the skin daily at 10 pm. 45 mL  1  . Insulin Pen Needle 32G X 4 MM MISC Use to inject insulin DX E11.8 100 each 3  . metoprolol succinate (TOPROL-XL) 50 MG 24 hr tablet Take 1 tablet (50 mg total) by mouth daily. Take with or immediately following a meal. 90 tablet 3  . omeprazole (PRILOSEC) 40 MG capsule Take 1 capsule (40 mg total) by mouth daily. 30 capsule 5  . Continuous Blood Gluc Receiver (FREESTYLE LIBRE 14 DAY READER) DEVI 1 applicator by Does not apply route daily. (Patient not taking: Reported on 05/25/2019) 1 Device 0  . Continuous Blood Gluc Sensor (FREESTYLE LIBRE 14 DAY SENSOR) MISC 2 application by Does not apply route daily. (Patient not taking: Reported on 05/25/2019) 2 each 11   No current facility-administered medications for this visit.     Functional Status:  In your present state of health, do you have any difficulty performing the following activities: 03/04/2019  Hearing? N  Vision? N  Difficulty concentrating or making decisions? N  Walking or climbing stairs? N  Dressing or bathing? N  Doing errands, shopping? N  Some recent data might be hidden    Fall/Depression Screening: Fall Risk  05/25/2019 04/02/2019 03/04/2019  Falls in the past year? 0 0 0   PHQ 2/9 Scores 03/04/2019 02/04/2019  PHQ - 2 Score 0 0    Assessment: Patient will continue to benefit  from health coach outreach for disease management and support. THN CM Care Plan Problem One     Most Recent Value  THN Long Term Goal   In 60 days the patient will verbalize that he has lowered his a1c by 1-2 points  [facility] Long Term Goal Start Date  05/25/19  Interventions for Problem One Long Term Goal  [place] blood sugars, discussed why it is important to check you r blood sugars, how diet affects blood sugars, encouraged medication adherence       Plan: [place] will contact patient in the month of November and patient agrees to next outreach.    Lazaro Arms RN, BSN, [place] Direct Dial:  [phone]  Fax: [phone]

## 2019-06-10 ENCOUNTER — Ambulatory Visit: Payer: 59

## 2019-06-10 NOTE — Progress Notes (Deleted)
Patient ID: Howard Calhoun                 DOB: 24-Jul-1971                      MRN: [id number]     HPI:  Howard Calhoun is a 48 y.o. male referred by Dr. Gwenlyn Found to HTN clinic. PMH includes hypertropic cardiomyopathy, hypertension, hyperlipidemia with LDL-c 100, and cardiac murmur. During   Current HTN meds:   Previously tried:    BP goal: 130/80  Family History:   Social History:   Diet:   Exercise:   Home BP readings:   Wt Readings from Last 3 Encounters:  04/27/19 244 lb (110.7 kg)  02/03/19 244 lb (110.7 kg)  08/05/18 245 lb 2 oz (111.2 kg)   BP Readings from Last 3 Encounters:  04/27/19 100/70  02/03/19 118/84  08/05/18 130/72   Pulse Readings from Last 3 Encounters:  04/27/19 90  02/03/19 99  08/05/18 76    Renal function: CrCl cannot be calculated (Patient's most recent lab result is older than the maximum 21 days allowed.).  Past Medical History:  Diagnosis Date  . Diabetes mellitus without complication (Howard)   . Hyperlipidemia   . Hypertension     Current Outpatient Medications on File Prior to Visit  Medication Sig Dispense Refill  . atorvastatin (LIPITOR) 40 MG tablet Take 1 tablet (40 mg total) by mouth daily. 90 tablet 1  . Blood Glucose Monitoring Suppl (ONETOUCH VERIO FLEX SYSTEM) w/Device KIT 1 Act by Does not apply route 3 (three) times daily with meals. 2 kit 0  . Cholecalciferol 1.25 MG (50000 UT) capsule Take 1 capsule (50,000 Units total) by mouth once a week. 12 capsule 0  . Continuous Blood Gluc Receiver (FREESTYLE LIBRE 14 DAY READER) DEVI 1 applicator by Does not apply route daily. (Patient not taking: Reported on 05/25/2019) 1 Device 0  . Continuous Blood Gluc Sensor (FREESTYLE LIBRE 14 DAY SENSOR) MISC 2 application by Does not apply route daily. (Patient not taking: Reported on 05/25/2019) 2 each 11  . dapagliflozin propanediol (FARXIGA) 5 MG TABS tablet Take 5 mg by mouth daily. 70 tablet 0  . EDARBYCLOR 40-12.5 MG TABS TAKE ONE  TABLET BY MOUTH DAILY  30 tablet 5  . glimepiride (AMARYL) 2 MG tablet TAKE 1 TABLET(2 MG) BY MOUTH DAILY WITH BREAKFAST 30 tablet 0  . glucose blood (ONETOUCH VERIO) test strip Use TID 100 each 12  . Insulin Detemir (LEVEMIR FLEXTOUCH) 100 UNIT/ML Pen Inject 60 Units into the skin daily at 10 pm. 45 mL 1  . Insulin Pen Needle 32G X 4 MM MISC Use to inject insulin DX E11.8 100 each 3  . metoprolol succinate (TOPROL-XL) 50 MG 24 hr tablet Take 1 tablet (50 mg total) by mouth daily. Take with or immediately following a meal. 90 tablet 3  . omeprazole (PRILOSEC) 40 MG capsule Take 1 capsule (40 mg total) by mouth daily. 30 capsule 5   No current facility-administered medications on file prior to visit.     Allergies  Allergen Reactions  . Metformin And Related Diarrhea    There were no vitals taken for this visit.  No problem-specific Assessment & Plan notes found for this encounter.    Aleksander Edmiston Rodriguez-Guzman PharmD, BCPS, [place] [id number] 06/10/2019 1:47 PM

## 2019-06-24 ENCOUNTER — Ambulatory Visit: Payer: 59

## 2019-07-09 ENCOUNTER — Other Ambulatory Visit: Payer: Self-pay

## 2019-07-09 NOTE — Patient Outreach (Addendum)
[facility]) Care Management  07/09/2019  [name] 07/20/1971 [id number]    1st unsuccessful attempt to follow up with the patient.  Answering machine picked up but unable to leave a message due to the mailbox was full.  Plan:RN Health Coach will send letter. RN Health Coach will make another outreach attempt to the patient within thirty business days.   Lazaro Arms RN, BSN, [place] Direct Dial:  [phone]  Fax: [phone]

## 2019-07-27 ENCOUNTER — Ambulatory Visit: Payer: 59

## 2019-07-28 ENCOUNTER — Ambulatory Visit: Payer: 59 | Admitting: Cardiovascular Disease

## 2019-07-30 ENCOUNTER — Other Ambulatory Visit: Payer: Self-pay | Admitting: *Deleted

## 2019-07-30 NOTE — Patient Outreach (Signed)
[place] [facility] Care Management  07/30/2019  Howard Calhoun 09-27-1970 [id number]   RN Health Coach Monthly Outreach  Referral Date:  02/05/2019 Referral Source:  MD Referral Reason for Referral:  Disease Management Education Insurance:  [employer]   Outreach Attempt:  Outreach attempt #2 to patient for introduction and follow up.  No answer and unable to leave voicemail message due to voicemail mailbox being full.   Plan:  RN Health Coach will make another outreach attempt within the month of December.   [name] [phone] [name]

## 2019-08-06 ENCOUNTER — Other Ambulatory Visit: Payer: Self-pay | Admitting: Internal Medicine

## 2019-08-06 NOTE — Telephone Encounter (Signed)
Attempted to reach pt, he has not been seen in a year. He has already received a courtsey refill and now an appointment is needed

## 2019-08-10 ENCOUNTER — Telehealth: Payer: Self-pay

## 2019-08-10 NOTE — Telephone Encounter (Signed)
Key: [id number]

## 2019-08-17 ENCOUNTER — Other Ambulatory Visit: Payer: Self-pay

## 2019-08-17 DIAGNOSIS — I422 Other hypertrophic cardiomyopathy: Secondary | ICD-10-CM

## 2019-08-17 NOTE — Telephone Encounter (Signed)
PA for Levemir was denied.

## 2019-08-17 NOTE — Progress Notes (Signed)
Put in genetic testing order per Dr Gwenlyn Found request.

## 2019-08-17 NOTE — Telephone Encounter (Signed)
What is the preferred alternative?  TJ

## 2019-08-18 ENCOUNTER — Other Ambulatory Visit: Payer: Self-pay | Admitting: Internal Medicine

## 2019-08-18 NOTE — Telephone Encounter (Signed)
levemir is covered Tier 2 He is due for an A1C  TJ

## 2019-08-18 NOTE — Telephone Encounter (Signed)
I printed for you to review.

## 2019-08-19 NOTE — Telephone Encounter (Signed)
lvm for pt to call back.   Please get me when he calls.   RE: discuss the insulin PA and scheduling an appt to recheck A1c

## 2019-08-26 ENCOUNTER — Encounter: Payer: 59 | Admitting: Genetic Counselor

## 2019-09-06 ENCOUNTER — Other Ambulatory Visit: Payer: Self-pay | Admitting: *Deleted

## 2019-09-06 ENCOUNTER — Encounter: Payer: Self-pay | Admitting: *Deleted

## 2019-09-06 NOTE — Patient Outreach (Signed)
[place] [facility]) Care Management  [place]  09/06/2019   KASEEM VASTINE 11/26/1970 [id number]   [place] Monthly Outreach   Referral Date:  02/05/2019 Referral Source:  MD Referral Reason for Referral:  Disease Management Education Insurance:  [employer]   Outreach Attempt:  Successful telephone outreach to patient for follow up.  HIPAA verified with patient.  Patient stating he only has few minutes to speak as he is caring for his wife with COVID.  States his test was negative.  Patient also reporting he is out of his Amaryl prescription and provider will not refill due to patient needing to schedule an appointment to be seen.  Dr. Kelton Pillar, Endocrinologist is prescribing provider and patient has not seen since 07/04/2018.  States he has been out of the medication for the past few weeks and has not been monitoring his blood sugars while not taking medication.  Does report he is taking his Levemir.  States Wilder Glade was too expensive and he is no longer taking.  Reports his last blood sugars were ranging 170-180's.  Acknowledges his last Hgb A1C was increased to 12.6.  Encouraged patient to arrange follow up appointments with primary care and/or Endocrinologist to be seen and discuss medications.  Discussed [place] assistance for medication assistance and patient to let this [place] know once medications discussed with providers.  Patient also confused about recent Cardiology consultation and need for follow up and follow up with Geneticist.  States he was initially told everything was ok and then told he needed to change medications and be seen by Geneticist.  Discussed with patient that the Cardiologist was concerned with Hypertrophic Cardiomyopathy diagnosis and encouraged him they/Cardiologist would be the best person to give him updates and answers.  Encounter Medications:  Outpatient Encounter Medications as of 09/06/2019   Medication Sig Note  . atorvastatin (LIPITOR) 40 MG tablet Take 1 tablet (40 mg total) by mouth daily.   . Insulin Detemir (LEVEMIR FLEXTOUCH) 100 UNIT/ML Pen Inject 60 Units into the skin daily at 10 pm.   . metoprolol succinate (TOPROL-XL) 50 MG 24 hr tablet Take 1 tablet (50 mg total) by mouth daily. Take with or immediately following a meal.   . omeprazole (PRILOSEC) 40 MG capsule Take 1 capsule (40 mg total) by mouth daily.   . Blood Glucose Monitoring Suppl (ONETOUCH VERIO FLEX SYSTEM) w/Device KIT 1 Act by Does not apply route 3 (three) times daily with meals.   . Cholecalciferol 1.25 MG (50000 UT) capsule Take 1 capsule (50,000 Units total) by mouth once a week. (Patient not taking: Reported on 09/06/2019) 09/06/2019: Reports no longer taking  . Continuous Blood Gluc Receiver (FREESTYLE LIBRE 14 DAY READER) DEVI 1 applicator by Does not apply route daily. (Patient not taking: Reported on 05/25/2019)   . Continuous Blood Gluc Sensor (FREESTYLE LIBRE 14 DAY SENSOR) MISC 2 application by Does not apply route daily. (Patient not taking: Reported on 05/25/2019)   . dapagliflozin propanediol (FARXIGA) 5 MG TABS tablet Take 5 mg by mouth daily. (Patient not taking: Reported on 09/06/2019)   . EDARBYCLOR 40-12.5 MG TABS TAKE ONE TABLET BY MOUTH DAILY  (Patient not taking: Reported on 09/06/2019) 09/06/2019: Reports not longer taking  . glimepiride (AMARYL) 2 MG tablet TAKE 1 TABLET(2 MG) BY MOUTH DAILY WITH BREAKFAST (Patient not taking: Reported on 09/06/2019) 09/06/2019: Needs medication refilled and has to see provider prior to refill  . glucose blood (ONETOUCH VERIO) test  strip Use TID   . Insulin Pen Needle 32G X 4 MM MISC Use to inject insulin DX E11.8    No facility-administered encounter medications on file as of 09/06/2019.    Functional Status:  In your present state of health, do you have any difficulty performing the following activities: 03/04/2019  Hearing? N  Vision? N  Difficulty  concentrating or making decisions? N  Walking or climbing stairs? N  Dressing or bathing? N  Doing errands, shopping? N  Some recent data might be hidden    Fall/Depression Screening: Fall Risk  05/25/2019 04/02/2019 03/04/2019  Falls in the past year? 0 0 0   PHQ 2/9 Scores 03/04/2019 02/04/2019  PHQ - 2 Score 0 0   THN CM Care Plan Problem One     Most Recent Value  Care Plan Problem One  Knowledge deficiet related to self care management of diabetes.  Role Documenting the Problem One  [place] for Problem One  Active  [facility] Long Term Goal   Patient will report a decrease in Hgb A1C by 1 point in the next 90 days.  THN Long Term Goal Start Date  09/06/19  Interventions for Problem One Long Term Goal  Care plan and goals reviewed and discussed, reviewed medications and indications and encouraged medication compliance, discussed and reviewed current elevated Hgb A1C and ways to help reduce this, encouraged diabetic diet, discussed importance of medical follow up with primary care, endocrinology, and cardiology, sending education on Hypertrophic Cardiomyopathy,   THN CM Short Term Goal #1   Patient will schedule follow up appointment with primary care provider or endocrinologist within the next 30 days.  THN CM Short Term Goal #1 Start Date  09/06/19  Interventions for Short Term Goal #1  Reviewed with patient the importance of medical follow up, reviewed name of last Endocrinologist patient last seen that prescribed Amaryl, encouraged patient to shcedule an appointment with Endocrinologist or follow up appointment with primary care to discuss medications and refill prescriptions, encouraged patient to notify this [place] if he is unable to afford medications for [place] referral for medication assistance, discussed purpose/need for Endocrinologist follow up to help manage diabetes  THN CM Short Term Goal #2   Patient will report monitoring blood sugars at least daily in  the next 30 days.  THN CM Short Term Goal #2 Start Date  09/06/19  Interventions for Short Term Goal #2  Confirmed patient knows how to monitor blood sugars, discussed importance of monitoring blood sugars, encouraged patient to monitor blood sugars at least daily and to notify provider of sustained elevations     Appointments:  Patient last saw primary care provider, Dr. Ronnald Ramp on 02/03/2019, Dr. Kelton Pillar Endocrinologist on 07/07/2018.  Encouraged patient to schedule follow up appointments as soon as possible.  Last attended consultation with Dr. Gwenlyn Found, Cardiologist on 04/27/2019 and has cancelled several follow up appointments, encouraged patient to reschedule appointment as soon as possible.  Plan: RN Health Coach will send patient Hypertrophic Cardiomyopathy Education. RN Health Coach will send primary care provider quarterly update. RN Health Coach will make next telephone outreach to patient within the month of January and patient agreeable to future outreach.  [place] [phone] [email or web address]

## 2019-10-08 ENCOUNTER — Other Ambulatory Visit: Payer: Self-pay | Admitting: *Deleted

## 2019-10-08 NOTE — Patient Outreach (Signed)
[facility]) Care Management  10/08/2019  ELIUD POLO 10/29/1970 [id number]   RN Health Coach Monthly Outreach  Referral Date:02/05/2019 Referral Source:MD Referral Reason for Referral:Disease Management Education Insurance:[employer] Medicare   Outreach Attempt:  Outreach attempt #1 to patient for follow up. No answer. RN Health Coach left HIPAA compliant voicemail message along with contact information.  Plan:  RN Health Coach will make another outreach attempt within the month of February if no call back from patient.  Rhae Lerner RN [facility] Care Management  RN Health Coach [phone] Carlyle [email or web address]

## 2019-11-09 ENCOUNTER — Other Ambulatory Visit: Payer: Self-pay | Admitting: *Deleted

## 2019-11-09 NOTE — Patient Outreach (Signed)
[facility]) Care Management  11/09/2019  BRAXEN DOBEK 02/21/1971 [id number]   RN Health Coach Monthly Outreach  Referral Date:02/05/2019 Referral Source:MD Referral Reason for Referral:Disease Management Education Insurance:[employer] Medicare   Outreach Attempt:  Outreach attempt #2 to patient for follow up. Patient answered and stated he was unable to speak at this time.  Requesting call back another day and time.   Plan:  RN Health Coach will make another outreach attempt within the month of March.  Rhae Lerner RN [facility] Care Management  RN Health Coach [phone] Kahliya [email or web address]

## 2019-12-08 ENCOUNTER — Other Ambulatory Visit: Payer: Self-pay | Admitting: *Deleted

## 2019-12-08 NOTE — Patient Outreach (Signed)
[facility]) Care Management  12/08/2019  ZAKK BORGEN Oct 05, 1970 [id number]   RN Health Coach Monthly Outreach  Referral Date:02/05/2019 Referral Source:MD Referral Reason for Referral:Disease Management Education Insurance:[employer] Medicare   Outreach Attempt:  Outreach attempt #3 to patient for follow up.  Patient answered and stated he was unavailable to speak at this time.  Requested call back another day and time.   Plan:  RN Health Coach will make another outreach attempt within the month of April if no return call back from patient.  Rhae Lerner RN [facility] Care Management  RN Health Coach [phone] [email or web address]

## 2020-01-05 ENCOUNTER — Other Ambulatory Visit: Payer: Self-pay | Admitting: *Deleted

## 2020-01-05 NOTE — Patient Outreach (Signed)
[facility]) Care Management  01/05/2020  Howard Calhoun 1971/06/30 [id number]   RN Health Coach Monthly Outreach  Referral Date:02/05/2019 Referral Source:MD Referral Reason for Referral:Disease Management Education Insurance:[employer] Medicare   Outreach Attempt:  Outreach attempt #4 to patient for follow up.  Patient answered and stated he was working and was on another line; requesting call back.   Plan:  RN Health Coach will make another outreach attempt within the month of May if no return call back from patient.  Rhae Lerner RN [facility] Care Management  RN Health Coach [phone] [name]

## 2020-01-27 ENCOUNTER — Encounter: Payer: 59 | Admitting: Genetic Counselor

## 2020-02-04 ENCOUNTER — Other Ambulatory Visit: Payer: Self-pay | Admitting: *Deleted

## 2020-02-04 NOTE — Patient Outreach (Signed)
[facility]) Care Management  02/04/2020  Howard Calhoun 1970-12-15 [id number]   RN Health Coach Monthly Outreach  Referral Date:02/05/2019 Referral Source:MD Referral Reason for Referral:Disease Management Education Insurance:[employer] Medicare   Outreach Attempt:  Outreach attempt #5 to patient for follow up.  Patient answered and stated he was working and would like to return call when he is able to speak,   Plan:  RN Health Coach will make another outreach attempt within the month of June if patient does not return call.  Rhae Lerner RN [facility] Care Management  RN Health Coach [phone] [email or web address]

## 2020-03-08 ENCOUNTER — Other Ambulatory Visit: Payer: Self-pay | Admitting: *Deleted

## 2020-03-08 NOTE — Patient Outreach (Signed)
[facility]) Care Management  03/08/2020  Howard Calhoun 1970-11-15 [id number]   RN Health Coach Monthly Outreach  Referral Date:02/05/2019 Referral Source:MD Referral Reason for Referral:Disease Management Education Insurance:[employer] Medicare   Outreach Attempt:  Outreach attempt #6 to patient for follow up. No answer. RN Health Coach left HIPAA compliant voicemail message along with contact information.  Plan:  RN Health Coach will make another outreach attempt within the month of July if no return call back from patient.   Rhae Lerner RN [facility] Care Management  RN Health Coach [phone] [email or web address]

## 2020-04-04 ENCOUNTER — Other Ambulatory Visit: Payer: Self-pay | Admitting: *Deleted

## 2020-04-04 NOTE — Patient Outreach (Signed)
[facility]) Care Management  04/04/2020  Howard Calhoun 06/23/1971 [id number]   RN Health Coach Monthly Outreach  Referral Date:02/05/2019 Referral Source:MD Referral Reason for Referral:Disease Management Education Insurance:[employer] Medicare   Outreach Attempt:  Outreach attempt #7 to patient for follow up.  Patient answered and stated he was unable to speak at this time.  Plan:  RN Health Coach will make another outreach attempt within the month of August if no return call back from patient.   Rhae Lerner RN [facility] Care Management  RN Health Coach [phone] [email or web address]

## 2020-04-25 ENCOUNTER — Encounter (HOSPITAL_COMMUNITY): Payer: Self-pay | Admitting: Cardiovascular Disease

## 2020-04-25 ENCOUNTER — Other Ambulatory Visit (HOSPITAL_COMMUNITY): Payer: 59

## 2020-05-05 ENCOUNTER — Encounter: Payer: Self-pay | Admitting: *Deleted

## 2020-05-05 ENCOUNTER — Other Ambulatory Visit: Payer: Self-pay | Admitting: *Deleted

## 2020-05-05 NOTE — Patient Outreach (Signed)
[facility]) Care Management  05/05/2020  Howard Calhoun [birth year] [id number]   RN Health Coach Monthly Outreach  Referral Date:02/05/2019 Referral Source:MD Referral Reason for Referral:Disease Management Education Insurance:[employer] Medicare   Outreach Attempt:  Successful telephone outreach to patient for follow up.  HIPAA verified with patient.  Patient admits to not taking his medications for the last several months, not monitoring his blood sugars.  States he ran out of his medications and the provider will not refill without patient being seen.  Last appointment with primary care provider, Dr. Yetta Calhoun was 02/03/2019.  Has missed Cardiologist appointments, last was 04/25/2020 for echocardiogram.  Discussed this with patient and he stated he would rescheduled.  Discussed with patient the importance of medication compliance and maintaining follow up appointments with providers.  Patient stating he is at a point where he is wanting to take his care more seriously and plans to arrange appointment with primary care provider to reestablish care and get new prescriptions.  RN Health Coach offered to arrange appointment for patient with Dr. Yetta Calhoun, patient declines at this time and states he will schedule himself.  Again reiterated the importance of reestablishing primary care and getting medication refills and medication compliance.  Plan: RN Health Coach will make next telephone outreach to patient within the month of September and patient agrees to future outreach.  [place] Lerner RN [facility] Care Management  RN Health Coach [phone] [email or web address]

## 2020-06-02 ENCOUNTER — Other Ambulatory Visit: Payer: Self-pay | Admitting: *Deleted

## 2020-06-02 NOTE — Patient Outreach (Signed)
[facility]) Care Management  06/02/2020  Howard Calhoun February 25, 1971 [id number]   RN Health Coach Monthly Outreach  Referral Date:02/05/2019 Referral Source:MD Referral Reason for Referral:Disease Management Education Insurance:[employer]    Outreach Attempt:  Successful telephone outreach to patient for follow up.  HIPAA verified with patient.  Patient reporting he is on his way out of town and preparing to start driving in just a few minutes.  Does report he has not scheduled appointment with provider and has not obtained medications at this time.  States he plans to schedule appointment for next week soon as he is done with this call.  Encouraged patient to schedule appointment as soon as possible so as to obtain medications.  Plan: RN Health Coach will make next telephone outreach to patient within the month of October and patient agrees to future outreach.  Rhae Lerner RN [facility] Care Management  RN Health Coach [phone] [email or web address]

## 2020-06-22 ENCOUNTER — Ambulatory Visit (INDEPENDENT_AMBULATORY_CARE_PROVIDER_SITE_OTHER): Payer: 59 | Admitting: Family Medicine

## 2020-06-22 ENCOUNTER — Other Ambulatory Visit: Payer: Self-pay

## 2020-06-22 ENCOUNTER — Ambulatory Visit: Payer: Self-pay

## 2020-06-22 ENCOUNTER — Ambulatory Visit (INDEPENDENT_AMBULATORY_CARE_PROVIDER_SITE_OTHER): Payer: 59 | Admitting: Internal Medicine

## 2020-06-22 ENCOUNTER — Ambulatory Visit (INDEPENDENT_AMBULATORY_CARE_PROVIDER_SITE_OTHER): Payer: 59

## 2020-06-22 ENCOUNTER — Encounter: Payer: Self-pay | Admitting: Internal Medicine

## 2020-06-22 ENCOUNTER — Encounter: Payer: Self-pay | Admitting: Family Medicine

## 2020-06-22 VITALS — BP 140/100 | HR 68 | Ht 71.0 in | Wt 232.0 lb

## 2020-06-22 VITALS — BP 184/116 | HR 85 | Temp 98.7°F | Resp 16 | Ht 71.0 in | Wt 232.0 lb

## 2020-06-22 DIAGNOSIS — K2100 Gastro-esophageal reflux disease with esophagitis, without bleeding: Secondary | ICD-10-CM | POA: Diagnosis not present

## 2020-06-22 DIAGNOSIS — Z23 Encounter for immunization: Secondary | ICD-10-CM | POA: Diagnosis not present

## 2020-06-22 DIAGNOSIS — R2232 Localized swelling, mass and lump, left upper limb: Secondary | ICD-10-CM | POA: Diagnosis not present

## 2020-06-22 DIAGNOSIS — Z1159 Encounter for screening for other viral diseases: Secondary | ICD-10-CM

## 2020-06-22 DIAGNOSIS — M659 Synovitis and tenosynovitis, unspecified: Secondary | ICD-10-CM

## 2020-06-22 DIAGNOSIS — I10 Essential (primary) hypertension: Secondary | ICD-10-CM

## 2020-06-22 DIAGNOSIS — R9431 Abnormal electrocardiogram [ECG] [EKG]: Secondary | ICD-10-CM

## 2020-06-22 DIAGNOSIS — E785 Hyperlipidemia, unspecified: Secondary | ICD-10-CM

## 2020-06-22 DIAGNOSIS — R0609 Other forms of dyspnea: Secondary | ICD-10-CM

## 2020-06-22 DIAGNOSIS — Z0000 Encounter for general adult medical examination without abnormal findings: Secondary | ICD-10-CM

## 2020-06-22 DIAGNOSIS — R202 Paresthesia of skin: Secondary | ICD-10-CM | POA: Diagnosis not present

## 2020-06-22 DIAGNOSIS — E118 Type 2 diabetes mellitus with unspecified complications: Secondary | ICD-10-CM | POA: Diagnosis not present

## 2020-06-22 DIAGNOSIS — R0600 Dyspnea, unspecified: Secondary | ICD-10-CM

## 2020-06-22 DIAGNOSIS — Z1211 Encounter for screening for malignant neoplasm of colon: Secondary | ICD-10-CM

## 2020-06-22 DIAGNOSIS — M65949 Unspecified synovitis and tenosynovitis, unspecified hand: Secondary | ICD-10-CM

## 2020-06-22 DIAGNOSIS — E559 Vitamin D deficiency, unspecified: Secondary | ICD-10-CM

## 2020-06-22 LAB — HEPATIC FUNCTION PANEL
ALT: 11 U/L (ref 0–53)
AST: 12 U/L (ref 0–37)
Albumin: 4.2 g/dL (ref 3.5–5.2)
Alkaline Phosphatase: 54 U/L (ref 39–117)
Bilirubin, Direct: 0.1 mg/dL (ref 0.0–0.3)
Total Bilirubin: 0.7 mg/dL (ref 0.2–1.2)
Total Protein: 7.3 g/dL (ref 6.0–8.3)

## 2020-06-22 LAB — URINALYSIS, ROUTINE W REFLEX MICROSCOPIC
Bilirubin Urine: NEGATIVE
Hgb urine dipstick: NEGATIVE
Ketones, ur: 15 — AB
Leukocytes,Ua: NEGATIVE
Nitrite: NEGATIVE
RBC / HPF: NONE SEEN (ref 0–?)
Specific Gravity, Urine: >=1.03 — AB (ref 1.000–1.030)
Total Protein, Urine: NEGATIVE
Urine Glucose: >=1000 — AB
Urobilinogen, UA: 0.2 (ref 0.0–1.0)
pH: 6 (ref 5.0–8.0)

## 2020-06-22 LAB — MICROALBUMIN / CREATININE URINE RATIO
Creatinine,U: 144.1 mg/dL
Microalb Creat Ratio: 4.6 mg/g (ref 0.0–30.0)
Microalb, Ur: 6.7 mg/dL — ABNORMAL HIGH (ref 0.0–1.9)

## 2020-06-22 LAB — CBC WITH DIFFERENTIAL/PLATELET
Basophils Absolute: 0 10*3/uL (ref 0.0–0.1)
Basophils Relative: 0.3 % (ref 0.0–3.0)
Eosinophils Absolute: 0 10*3/uL (ref 0.0–0.7)
Eosinophils Relative: 0.4 % (ref 0.0–5.0)
HCT: 42.5 % (ref 39.0–52.0)
Hemoglobin: 14.1 g/dL (ref 13.0–17.0)
Lymphocytes Relative: 31.1 % (ref 12.0–46.0)
Lymphs Abs: 2.2 10*3/uL (ref 0.7–4.0)
MCHC: 33.1 g/dL (ref 30.0–36.0)
MCV: 86.9 fl (ref 78.0–100.0)
Monocytes Absolute: 0.4 10*3/uL (ref 0.1–1.0)
Monocytes Relative: 5.2 % (ref 3.0–12.0)
Neutro Abs: 4.4 10*3/uL (ref 1.4–7.7)
Neutrophils Relative %: 63 % (ref 43.0–77.0)
Platelets: 350 10*3/uL (ref 150.0–400.0)
RBC: 4.89 Mil/uL (ref 4.22–5.81)
RDW: 13.6 % (ref 11.5–15.5)
WBC: 7 10*3/uL (ref 4.0–10.5)

## 2020-06-22 LAB — HEMOGLOBIN A1C: Hgb A1c MFr Bld: 11.8 % — ABNORMAL HIGH (ref 4.6–6.5)

## 2020-06-22 LAB — VITAMIN D 25 HYDROXY (VIT D DEFICIENCY, FRACTURES): VITD: 19.16 ng/mL — ABNORMAL LOW (ref 30.00–100.00)

## 2020-06-22 LAB — TSH: TSH: 0.82 u[IU]/mL (ref 0.35–4.50)

## 2020-06-22 LAB — LIPID PANEL
Cholesterol: 237 mg/dL — ABNORMAL HIGH (ref 0–200)
HDL: 49.7 mg/dL (ref >39.00)
LDL Cholesterol: 165 mg/dL — ABNORMAL HIGH (ref 0–99)
NonHDL: 187.53
Total CHOL/HDL Ratio: 5
Triglycerides: 113 mg/dL (ref 0.0–149.0)
VLDL: 22.6 mg/dL (ref 0.0–40.0)

## 2020-06-22 LAB — BASIC METABOLIC PANEL
BUN: 15 mg/dL (ref 6–23)
CO2: 31 mEq/L (ref 19–32)
Calcium: 9.6 mg/dL (ref 8.4–10.5)
Chloride: 99 mEq/L (ref 96–112)
Creatinine, Ser: 0.89 mg/dL (ref 0.40–1.50)
GFR: 101.61 mL/min (ref >60.00)
Glucose, Bld: 244 mg/dL — ABNORMAL HIGH (ref 70–99)
Potassium: 3.9 mEq/L (ref 3.5–5.1)
Sodium: 136 mEq/L (ref 135–145)

## 2020-06-22 LAB — PSA: PSA: 0.4 ng/mL (ref 0.10–4.00)

## 2020-06-22 LAB — BRAIN NATRIURETIC PEPTIDE: Pro B Natriuretic peptide (BNP): 26 pg/mL (ref 0.0–100.0)

## 2020-06-22 LAB — TROPONIN I (HIGH SENSITIVITY): High Sens Troponin I: 3 ng/L (ref 2–17)

## 2020-06-22 MED ORDER — EDARBYCLOR 40-12.5 MG PO TABS: 1.0000 | ORAL_TABLET | Freq: Every day | ORAL | 5 refills | Status: DC

## 2020-06-22 MED ORDER — BASAGLAR KWIKPEN 100 UNIT/ML ~~LOC~~ SOPN: 60.0000 [IU] | PEN_INJECTOR | Freq: Every day | SUBCUTANEOUS | 0 refills | Status: DC

## 2020-06-22 MED ORDER — FREESTYLE LIBRE 14 DAY SENSOR MISC: 2.0000 "application " | Freq: Every day | 11 refills | Status: DC

## 2020-06-22 MED ORDER — FREESTYLE LIBRE 14 DAY READER DEVI: 1.0000 | Freq: Every day | 11 refills | Status: DC

## 2020-06-22 MED ORDER — GABAPENTIN 300 MG PO CAPS: 300.0000 mg | ORAL_CAPSULE | Freq: Three times a day (TID) | ORAL | 3 refills | Status: DC | PRN

## 2020-06-22 MED ORDER — ATORVASTATIN CALCIUM 40 MG PO TABS: 40.0000 mg | ORAL_TABLET | Freq: Every day | ORAL | 1 refills | Status: DC

## 2020-06-22 NOTE — Patient Instructions (Signed)
Thank you for coming in today. Use the voltaren gel over the counter up to 4x daily for pain and swelling.  Take gabapentin up to 3x daily for nerve pain. It may make you sleepy.   Recheck in 2-4 weeks especially if not better.  Next step is hand physical therapy. Let me know.

## 2020-06-22 NOTE — Progress Notes (Signed)
Subjective:  Patient ID: Howard Calhoun, male    DOB: October 14, 1970  Age: 49 y.o. MRN: [id number]  CC: Annual Exam, Hypertension, Diabetes, and Hyperlipidemia  This visit occurred during the SARS-CoV-2 public health emergency.  Safety protocols were in place, including screening questions prior to the visit, additional usage of staff PPE, and extensive cleaning of exam room while observing appropriate contact time as indicated for disinfecting solutions.    HPI Howard Calhoun presents for a CPX.  He complains of a 1 year history of dyspnea on exertion and worsening lower extremity edema.  He is not treating his diabetes or hypertension.  He denies chest pain, diaphoresis, dizziness, lightheadedness, or headache.  He also complains of a 3-day history of pain, numbness, and swelling in his left thumb.  He denies trauma or injury.  He is getting adequate symptom relief with Motrin.  Outpatient Medications Prior to Visit  Medication Sig Dispense Refill  . Blood Glucose Monitoring Suppl (ONETOUCH VERIO FLEX SYSTEM) w/Device KIT 1 Act by Does not apply route 3 (three) times daily with meals. 2 kit 0  . glucose blood (ONETOUCH VERIO) test strip Use TID 100 each 12  . Insulin Pen Needle 32G X 4 MM MISC Use to inject insulin DX E11.8 100 each 3  . atorvastatin (LIPITOR) 40 MG tablet Take 1 tablet (40 mg total) by mouth daily. 90 tablet 1  . Cholecalciferol 1.25 MG (50000 UT) capsule Take 1 capsule (50,000 Units total) by mouth once a week. 12 capsule 0  . Continuous Blood Gluc Receiver (FREESTYLE LIBRE 14 DAY READER) DEVI 1 applicator by Does not apply route daily. 1 Device 0  . Continuous Blood Gluc Sensor (FREESTYLE LIBRE 14 DAY SENSOR) MISC 2 application by Does not apply route daily. 2 each 11  . dapagliflozin propanediol (FARXIGA) 5 MG TABS tablet Take 5 mg by mouth daily. 70 tablet 0  . EDARBYCLOR 40-12.5 MG TABS TAKE ONE TABLET BY MOUTH DAILY  30 tablet 5  . glimepiride (AMARYL) 2 MG tablet  TAKE 1 TABLET(2 MG) BY MOUTH DAILY WITH BREAKFAST 30 tablet 0  . Insulin Detemir (LEVEMIR FLEXTOUCH) 100 UNIT/ML Pen Inject 60 Units into the skin daily at 10 pm. 45 mL 1  . omeprazole (PRILOSEC) 40 MG capsule Take 1 capsule (40 mg total) by mouth daily. 30 capsule 5  . metoprolol succinate (TOPROL-XL) 50 MG 24 hr tablet Take 1 tablet (50 mg total) by mouth daily. Take with or immediately following a meal. 90 tablet 3   No facility-administered medications prior to visit.    ROS Review of Systems  Constitutional: Positive for unexpected weight change (wt loss). Negative for appetite change, chills, diaphoresis and fatigue.  HENT: Negative.  Negative for trouble swallowing.   Eyes: Negative for visual disturbance.  Respiratory: Positive for shortness of breath. Negative for cough, chest tightness and wheezing.   Cardiovascular: Negative for chest pain, palpitations and leg swelling.  Gastrointestinal: Negative for abdominal pain, constipation, diarrhea, nausea and vomiting.  Endocrine: Positive for polydipsia, polyphagia and polyuria.  Genitourinary: Negative.  Negative for difficulty urinating and dysuria.  Musculoskeletal: Positive for arthralgias. Negative for back pain and myalgias.  Skin: Negative.  Negative for color change, pallor and rash.  Neurological: Negative.  Negative for dizziness, weakness, light-headedness, numbness and headaches.  Hematological: Negative for adenopathy. Does not bruise/bleed easily.  Psychiatric/Behavioral: Negative.  Negative for sleep disturbance. The patient is not nervous/anxious.     Objective:  BP (!) 184/116  Pulse 85   Temp 98.7 F (37.1 C) (Oral)   Resp 16   Ht _0  (1.803 m)   Wt 232 lb (105.2 kg)   SpO2 97%   BMI 32.36 kg/m   BP Readings from Last 3 Encounters:  06/22/20 (!) 140/100  06/22/20 (!) 184/116  04/27/19 100/70    Wt Readings from Last 3 Encounters:  06/22/20 232 lb (105.2 kg)  06/22/20 232 lb (105.2 kg)  04/27/19  244 lb (110.7 kg)    Physical Exam Vitals reviewed.  Constitutional:      Appearance: Normal appearance. He is not ill-appearing.  HENT:     Nose: Nose normal.     Mouth/Throat:     Mouth: Mucous membranes are moist.  Eyes:     General: No scleral icterus.    Conjunctiva/sclera: Conjunctivae normal.  Cardiovascular:     Rate and Rhythm: Normal rate and regular rhythm.     Heart sounds: Normal heart sounds, S1 normal and S2 normal. No murmur heard.      Comments: EKG- NSR, 84 bpm LAD TWI in lateral leads is new No LVH or Q waves Pulmonary:     Effort: Pulmonary effort is normal.     Breath sounds: No stridor. No wheezing, rhonchi or rales.  Abdominal:     General: Abdomen is flat.     Tenderness: There is no abdominal tenderness. There is no guarding.     Hernia: There is no hernia in the right inguinal area.  Genitourinary:    Pubic Area: No rash.      Penis: Normal. No discharge, swelling or lesions.      Testes: Normal.        Right: Mass or tenderness not present.        Left: Mass or tenderness not present.     Epididymis:     Right: Normal. No mass.     Left: Normal.     Prostate: Normal. Not enlarged, not tender and no nodules present.     Rectum: Normal. Guaiac result negative. No mass, tenderness, anal fissure, external hemorrhoid or internal hemorrhoid. Normal anal tone.  Musculoskeletal:     Left hand: Swelling and tenderness present.     Cervical back: Neck supple. No tenderness.     Right lower leg: 1+ Pitting Edema present.     Left lower leg: 1+ Pitting Edema present.     Comments: Mild swelling and ttp in thumb and thenar area  Lymphadenopathy:     Lower Body: No right inguinal adenopathy. No left inguinal adenopathy.  Neurological:     Mental Status: He is alert.     Lab Results  Component Value Date   WBC 7.0 06/22/2020   HGB 14.1 06/22/2020   HCT 42.5 06/22/2020   PLT 350.0 06/22/2020   GLUCOSE 244 (H) 06/22/2020   CHOL 237 (H) 06/22/2020    TRIG 113.0 06/22/2020   HDL 49.70 06/22/2020   LDLCALC 165 (H) 06/22/2020   ALT 11 06/22/2020   AST 12 06/22/2020   NA 136 06/22/2020   K 3.9 06/22/2020   CL 99 06/22/2020   CREATININE 0.89 06/22/2020   BUN 15 06/22/2020   CO2 31 06/22/2020   TSH 0.82 06/22/2020   PSA 0.40 06/22/2020   HGBA1C 11.8 (H) 06/22/2020   MICROALBUR 6.7 (H) 06/22/2020    No results found.  Assessment & Plan:   Garek was seen today for annual exam, hypertension, diabetes and hyperlipidemia.  Diagnoses and all  orders for this visit:  Essential hypertension- His blood pressure is not adequately well controlled and he has lower extremity edema. I have asked him to restart the ARB and thiazide diuretic. -     Urinalysis, Routine w reflex microscopic; Future -     TSH; Future -     Cancel: BASIC METABOLIC PANEL WITH GFR; Future -     Azilsartan-Chlorthalidone (EDARBYCLOR) 40-12.5 MG TABS; Take 1 tablet by mouth daily. -     EKG 12-Lead -     Basic metabolic panel; Future -     Basic metabolic panel -     TSH -     Urinalysis, Routine w reflex microscopic  Gastroesophageal reflux disease with esophagitis without hemorrhage- He requested a refill on the PPI. -     CBC with Differential/Platelet; Future -     CBC with Differential/Platelet -     Discontinue: omeprazole (PRILOSEC) 40 MG capsule; Take 1 capsule (40 mg total) by mouth daily. -     omeprazole (PRILOSEC) 40 MG capsule; Take 1 capsule (40 mg total) by mouth daily.  Type 2 diabetes mellitus with complication, without long-term current use of insulin ([place])- His blood sugars are too high. He does not tolerate Metformin. I recommended that he is start using a basal/bolus insulin regimen. I have also recommended that he restart the sulfonylurea. I have encouraged him to monitor his blood sugar closely. -     Microalbumin / creatinine urine ratio; Future -     Urinalysis, Routine w reflex microscopic; Future -     Cancel: BASIC METABOLIC PANEL  WITH GFR; Future -     Ambulatory referral to Ophthalmology -     Continuous Blood Gluc Receiver (FREESTYLE LIBRE 14 DAY READER) DEVI; 1 applicator by Does not apply route daily. -     Continuous Blood Gluc Sensor (FREESTYLE LIBRE 14 DAY SENSOR) MISC; 2 application by Does not apply route daily. -     Azilsartan-Chlorthalidone (EDARBYCLOR) 40-12.5 MG TABS; Take 1 tablet by mouth daily. -     Insulin Glargine (BASAGLAR KWIKPEN) 100 UNIT/ML; Inject 60 Units into the skin daily. -     HM Diabetes Foot Exam -     Basic metabolic panel; Future -     Basic metabolic panel -     Urinalysis, Routine w reflex microscopic -     Microalbumin / creatinine urine ratio -     glimepiride (AMARYL) 2 MG tablet; Take 1 tablet (2 mg total) by mouth daily with breakfast. -     insulin aspart (NOVOLOG FLEXPEN) 100 UNIT/ML FlexPen; Inject 5 Units into the skin 3 (three) times daily with meals.  Hyperlipidemia with target LDL less than 100- I have asked him to take a statin for cardiovascular risk reduction. -     TSH; Future -     Hepatic function panel; Future -     Hemoglobin A1c; Future -     atorvastatin (LIPITOR) 40 MG tablet; Take 1 tablet (40 mg total) by mouth daily. -     Hemoglobin A1c -     Hepatic function panel -     TSH  Routine general medical examination at a health care facility- Exam completed, labs reviewed, vaccines reviewed and updated, cancer screenings addressed, patient education material was given. -     Lipid panel; Future -     PSA; Future -     HIV Antibody (routine testing w rflx); Future -  HIV Antibody (routine testing w rflx) -     PSA -     Lipid panel  Vitamin D deficiency disease -     VITAMIN D 25 Hydroxy (Vit-D Deficiency, Fractures); Future -     VITAMIN D 25 Hydroxy (Vit-D Deficiency, Fractures) -     Cholecalciferol 1.25 MG (50000 UT) capsule; Take 1 capsule (50,000 Units total) by mouth once a week.  Need for hepatitis C screening test -     Hepatitis C  antibody; Future -     Hepatitis C antibody  Tenosynovitis of thumb -     Ambulatory referral to Sports Medicine  DOE (dyspnea on exertion)- He has a 1 year history of DOE and subtle new changes on his EKG. His troponin and BNP are normal. I recommended that he undergo a myocardial perfusion imaging to screen for ischemia. -     Brain natriuretic peptide; Future -     Troponin I (High Sensitivity); Future -     Troponin I (High Sensitivity) -     Brain natriuretic peptide -     MYOCARDIAL PERFUSION IMAGING; Future  Abnormal electrocardiogram (ECG) (EKG) -     Brain natriuretic peptide; Future -     Troponin I (High Sensitivity); Future -     Troponin I (High Sensitivity) -     Brain natriuretic peptide -     MYOCARDIAL PERFUSION IMAGING; Future  Abnormal electrocardiogram -     MYOCARDIAL PERFUSION IMAGING; Future  Screen for colon cancer -     Ambulatory referral to Gastroenterology  Other orders -     Flu Vaccine QUAD 6+ mos PF IM (Fluarix Quad PF) -     Pneumococcal conjugate vaccine 13-valent   I have discontinued Jeneen Rinks T. Weatherbee's dapagliflozin propanediol, insulin detemir, and glimepiride. I have also changed his Edarbyclor. Additionally, I am having him start on Basaglar KwikPen, glimepiride, and NovoLOG FlexPen. Lastly, I am having him maintain his [place], glucose blood, Insulin Pen Needle, metoprolol succinate, FreeStyle Libre 14 Day Reader, FreeStyle Libre 14 Day Sensor, atorvastatin, Cholecalciferol, and omeprazole.  Meds ordered this encounter  Medications  . Continuous Blood Gluc Receiver (FREESTYLE LIBRE 14 DAY READER) DEVI    Sig: 1 applicator by Does not apply route daily.    Dispense:  2 each    Refill:  11  . Continuous Blood Gluc Sensor (FREESTYLE LIBRE 14 DAY SENSOR) MISC    Sig: 2 application by Does not apply route daily.    Dispense:  2 each    Refill:  11  . atorvastatin (LIPITOR) 40 MG tablet    Sig: Take 1 tablet (40 mg  total) by mouth daily.    Dispense:  90 tablet    Refill:  1  . Azilsartan-Chlorthalidone (EDARBYCLOR) 40-12.5 MG TABS    Sig: Take 1 tablet by mouth daily.    Dispense:  30 tablet    Refill:  5  . Insulin Glargine (BASAGLAR KWIKPEN) 100 UNIT/ML    Sig: Inject 60 Units into the skin daily.    Dispense:  9 mL    Refill:  0  . glimepiride (AMARYL) 2 MG tablet    Sig: Take 1 tablet (2 mg total) by mouth daily with breakfast.    Dispense:  90 tablet    Refill:  0  . insulin aspart (NOVOLOG FLEXPEN) 100 UNIT/ML FlexPen    Sig: Inject 5 Units into the skin 3 (three) times daily with meals.  Dispense:  9 mL    Refill:  1  . Cholecalciferol 1.25 MG (50000 UT) capsule    Sig: Take 1 capsule (50,000 Units total) by mouth once a week.    Dispense:  12 capsule    Refill:  0  . DISCONTD: omeprazole (PRILOSEC) 40 MG capsule    Sig: Take 1 capsule (40 mg total) by mouth daily.    Dispense:  30 capsule    Refill:  5  . omeprazole (PRILOSEC) 40 MG capsule    Sig: Take 1 capsule (40 mg total) by mouth daily.    Dispense:  90 capsule    Refill:  1   In addition to time spent on CPE, I spent 50 minutes in preparing to see the patient by review of recent labs, imaging and procedures, obtaining and reviewing separately obtained history, communicating with the patient and family or caregiver, ordering medications, tests or procedures, and documenting clinical information in the EHR including the differential Dx, treatment, and any further evaluation and other management of 1. Essential hypertension 2. Gastroesophageal reflux disease with esophagitis without hemorrhage 3. Type 2 diabetes mellitus with complication, without long-term current use of insulin ([place]) 4. Hyperlipidemia with target LDL less than 100 5. Vitamin D deficiency disease 6. Tenosynovitis of thumb 7. DOE (dyspnea on exertion) 8. Abnormal electrocardiogram (ECG) (EKG) 9. Abnormal electrocardiogram     Follow-up: Return in about  4 weeks (around 07/20/2020).  Scarlette Calico, MD

## 2020-06-22 NOTE — Progress Notes (Signed)
Subjective:    I'm seeing this patient as a consultation for:  Sanda Linger, MD. Note will be routed back to referring provider/PCP.  CC: Thumb/ hand pain   HPI: Patient is a 49 year old male presenting to Sports Medicine [facility] for pain in Left Hand/thumb. Locates pain to base of thumb and thumb itself X1week.  Patient describes pain as aching, throbbing and tingling. No MOI, patient thought it was due to his diabetes/neuropathy, but referred to [place] as the thumb is swollen. He had been off of his diabetes medicines for a while.  He is getting his diabetes medicines restarted today with his PCP.  His PCP also ordered a fair amount of labs to reestablish care.  He denies any triggering locking or catching into his thumb.  He notes swelling in the little bit of pain and some numbness.  Radiating: yes up the arm Numbness/tingling: yes  Swelling: yes  aggravating symptoms: gripping or trying to grab anything Tried: Ibuprofen   Past medical history, Surgical history, Family history, Social history, Allergies, and medications have been entered into the medical record, reviewed.   Review of Systems: No new headache, visual changes, nausea, vomiting, diarrhea, constipation, dizziness, abdominal pain, skin rash, fevers, chills, night sweats, weight loss, swollen lymph nodes, body aches, joint swelling, muscle aches, chest pain, shortness of breath, mood changes, visual or auditory hallucinations.   Objective:    Vitals:   06/22/20 1216  BP: (!) 140/100  Pulse: 68  SpO2: 97%   General: Well Developed, well nourished, and in no acute distress.  Neuro/Psych: Alert and oriented x3, extra-ocular muscles intact, able to move all 4 extremities, sensation grossly intact. Skin: Warm and dry, no rashes noted.  Respiratory: Not using accessory muscles, speaking in full sentences, trachea midline.  Cardiovascular: Pulses palpable, no extremity edema. Abdomen: Does not appear  distended. MSK: Left thumb slightly swollen and thenar eminence and into proximal and distal phalanx.  No erythema.  Normal thumb motion without triggering or locking. Mildly tender palpation thenar eminence and palmar MCP.  Mildly tender palpation CMC. Slight decrease sensation to light touch distal thumb. Negative Tinel's at carpal tunnel.  Lab and Radiology Results X-ray images left hand obtained today personally and independently interpreted. Mild first CMC DJD otherwise normal-appearing with no fractures or malformation. Await formal radiology review  Diagnostic Limited MSK Ultrasound of: Left thumb and wrist First CMC mild osteophyte formation at the joint along with mild joint effusion indicates DJD. Thenar eminence normal-appearing ultrasound. Flexor tendon at MCP A1 pulley largely normal-appearing without obvious triggering or nodule. Carpal tunnel margin normal-appearing.  Median nerve measures 11 mm cross-sectional area Impression: Mild CMC DJD possible mild carpal tunnel syndrome   Impression and Recommendations:    Assessment and Plan: 49 y.o. male with thumb pain and swelling along with numbness.  Etiology somewhat unclear.  He has had recently poorly controlled diabetes and has had a large metabolic work-up ordered today which may reveal fundamental underlying problem causing this.  Meantime today no discrete obvious orthopedic issue.  Plan for relative rest and trial of gabapentin.  Check back in about a month.  Would consider hand therapy or even injection at that point.  Would like to avoid steroid exposure now as his diabetes is not controlled.Marland Kitchen  PDMP not reviewed this encounter. Orders Placed This Encounter  Procedures  . [place] LIMITED JOINT SPACE STRUCTURES UP LEFT(NO LINKED CHARGES)    Standing Status:   Future  Number of Occurrences:   1    Standing Expiration Date:   06/22/2021    Order Specific Question:   Reason for Exam (SYMPTOM  OR DIAGNOSIS REQUIRED)     Answer:   Left hand pain    Order Specific Question:   Preferred imaging location?    Answer:   Adult nurse [facility]  . DG Hand Complete Left    Standing Status:   Future    Number of Occurrences:   1    Standing Expiration Date:   06/22/2021    Order Specific Question:   Reason for Exam (SYMPTOM  OR DIAGNOSIS REQUIRED)    Answer:   Left thumb pain and swelling    Order Specific Question:   Preferred imaging location?    Answer:   [facility]   Meds ordered this encounter  Medications  . gabapentin (NEURONTIN) 300 MG capsule    Sig: Take 1 capsule (300 mg total) by mouth 3 (three) times daily as needed (nerve pain).    Dispense:  90 capsule    Refill:  3    Discussed warning signs or symptoms. Please see discharge instructions. Patient expresses understanding.   The above documentation has been reviewed and is accurate and complete Clementeen Graham, M.D.

## 2020-06-22 NOTE — Patient Instructions (Signed)

## 2020-06-23 DIAGNOSIS — Z1211 Encounter for screening for malignant neoplasm of colon: Secondary | ICD-10-CM | POA: Insufficient documentation

## 2020-06-23 LAB — HEPATITIS C ANTIBODY
Hepatitis C Ab: NONREACTIVE
SIGNAL TO CUT-OFF: 0.08 (ref <1.00)

## 2020-06-23 LAB — HIV ANTIBODY (ROUTINE TESTING W REFLEX): HIV 1&2 Ab, 4th Generation: NONREACTIVE

## 2020-06-23 MED ORDER — OMEPRAZOLE 40 MG PO CPDR: 40.0000 mg | DELAYED_RELEASE_CAPSULE | Freq: Every day | ORAL | 1 refills | Status: DC

## 2020-06-23 MED ORDER — CHOLECALCIFEROL 1.25 MG (50000 UT) PO CAPS: 50000.0000 [IU] | ORAL_CAPSULE | ORAL | 0 refills | Status: DC

## 2020-06-23 MED ORDER — GLIMEPIRIDE 2 MG PO TABS: 2.0000 mg | ORAL_TABLET | Freq: Every day | ORAL | 0 refills | Status: DC

## 2020-06-23 MED ORDER — NOVOLOG FLEXPEN 100 UNIT/ML ~~LOC~~ SOPN: 5.0000 [IU] | PEN_INJECTOR | Freq: Three times a day (TID) | SUBCUTANEOUS | 1 refills | Status: DC

## 2020-06-23 MED ORDER — OMEPRAZOLE 40 MG PO CPDR: 40.0000 mg | DELAYED_RELEASE_CAPSULE | Freq: Every day | ORAL | 5 refills | Status: DC

## 2020-06-23 NOTE — Progress Notes (Signed)
X-ray left hand looks normal per radiology

## 2020-06-26 ENCOUNTER — Encounter: Payer: Self-pay | Admitting: Internal Medicine

## 2020-06-26 ENCOUNTER — Other Ambulatory Visit: Payer: Self-pay | Admitting: Internal Medicine

## 2020-06-26 ENCOUNTER — Encounter: Payer: Self-pay | Admitting: Family Medicine

## 2020-06-26 DIAGNOSIS — E118 Type 2 diabetes mellitus with unspecified complications: Secondary | ICD-10-CM

## 2020-06-27 ENCOUNTER — Other Ambulatory Visit: Payer: Self-pay | Admitting: Internal Medicine

## 2020-06-27 ENCOUNTER — Telehealth: Payer: Self-pay | Admitting: Internal Medicine

## 2020-06-27 MED ORDER — OLMESARTAN MEDOXOMIL-HCTZ 40-25 MG PO TABS: 1.0000 | ORAL_TABLET | Freq: Every day | ORAL | 1 refills | Status: DC

## 2020-06-27 NOTE — Addendum Note (Signed)
Addended by: Etta Grandchild on: 06/27/2020 07:40 AM   Modules accepted: Orders

## 2020-06-28 ENCOUNTER — Other Ambulatory Visit: Payer: Self-pay | Admitting: Internal Medicine

## 2020-06-28 DIAGNOSIS — E118 Type 2 diabetes mellitus with unspecified complications: Secondary | ICD-10-CM

## 2020-06-28 MED ORDER — GVOKE HYPOPEN 2-PACK 1 MG/0.2ML ~~LOC~~ SOAJ: 1.0000 | Freq: Every day | SUBCUTANEOUS | 3 refills | Status: DC | PRN

## 2020-06-28 MED ORDER — HUMALOG KWIKPEN 200 UNIT/ML ~~LOC~~ SOPN: 5.0000 [IU] | PEN_INJECTOR | Freq: Three times a day (TID) | SUBCUTANEOUS | 1 refills | Status: DC

## 2020-06-28 NOTE — Telephone Encounter (Signed)
error 

## 2020-06-29 ENCOUNTER — Encounter: Payer: 59 | Admitting: Internal Medicine

## 2020-07-06 ENCOUNTER — Encounter: Payer: Self-pay | Admitting: Internal Medicine

## 2020-07-06 ENCOUNTER — Ambulatory Visit: Payer: 59 | Admitting: Family Medicine

## 2020-07-06 NOTE — Progress Notes (Signed)
NO SHOW

## 2020-07-07 ENCOUNTER — Other Ambulatory Visit: Payer: Self-pay | Admitting: *Deleted

## 2020-07-07 NOTE — Patient Outreach (Signed)
[facility]) Care Management  07/07/2020  STORMY SABOL 26-Dec-1970 [id number]   RN Health Coach Monthly Outreach  Referral Date:02/05/2019 Referral Source:MD Referral Reason for Referral:Disease Management Education Insurance:[employer]    Outreach Attempt:  Outreach attempt #1 to patient for follow up.  Patient answered and stated he was busy at this time, requesting call back.   Plan:  RN Health Coach will make another outreach attempt within the next 15 business days.   Rhae Lerner RN [facility] Care Management  RN Health Coach [phone] [email or web address]

## 2020-07-10 ENCOUNTER — Telehealth: Payer: Self-pay | Admitting: Emergency Medicine

## 2020-07-10 NOTE — Telephone Encounter (Signed)
Called patient to let him know that his [facility] form has been completed and ready for his signature and pick up. Thanks.

## 2020-07-12 ENCOUNTER — Telehealth (HOSPITAL_COMMUNITY): Payer: Self-pay | Admitting: *Deleted

## 2020-07-12 NOTE — Telephone Encounter (Signed)
Patient given detailed instructions per Myocardial Perfusion Study Information Sheet for the test on 07/13/2020 at 1045. Patient notified to arrive 15 minutes early and that it is imperative to arrive on time for appointment to keep from having the test rescheduled.  If you need to cancel or reschedule your appointment, please call the office within 24 hours of your appointment. . Patient verbalized understanding.Rhyland Hinderliter, Adelene Idler

## 2020-07-13 ENCOUNTER — Ambulatory Visit (HOSPITAL_COMMUNITY): Payer: 59 | Attending: Cardiology

## 2020-07-13 ENCOUNTER — Other Ambulatory Visit: Payer: Self-pay

## 2020-07-13 DIAGNOSIS — R0600 Dyspnea, unspecified: Secondary | ICD-10-CM | POA: Insufficient documentation

## 2020-07-13 DIAGNOSIS — R9431 Abnormal electrocardiogram [ECG] [EKG]: Secondary | ICD-10-CM | POA: Insufficient documentation

## 2020-07-13 DIAGNOSIS — R0609 Other forms of dyspnea: Secondary | ICD-10-CM

## 2020-07-13 LAB — MYOCARDIAL PERFUSION IMAGING
LV dias vol: 120 mL (ref 62–150)
LV sys vol: 62 mL
Peak HR: 107 {beats}/min
Rest HR: 80 {beats}/min
SDS: 1
SRS: 3
SSS: 4
TID: 1.11

## 2020-07-13 MED ORDER — TECHNETIUM TC 99M TETROFOSMIN IV KIT
32.4000 | PACK | Freq: Once | INTRAVENOUS | Status: AC | PRN
  Administered 2020-07-13: 32.4 via INTRAVENOUS
  Filled 2020-07-13: qty 33

## 2020-07-13 MED ORDER — TECHNETIUM TC 99M TETROFOSMIN IV KIT
10.7000 | PACK | Freq: Once | INTRAVENOUS | Status: AC | PRN
  Administered 2020-07-13: 10.7 via INTRAVENOUS
  Filled 2020-07-13: qty 11

## 2020-07-13 MED ORDER — REGADENOSON 0.4 MG/5ML IV SOLN
0.4000 mg | Freq: Once | INTRAVENOUS | Status: AC
  Administered 2020-07-13: 0.4 mg via INTRAVENOUS

## 2020-07-14 ENCOUNTER — Encounter: Payer: Self-pay | Admitting: Internal Medicine

## 2020-07-14 ENCOUNTER — Telehealth: Payer: Self-pay | Admitting: Cardiovascular Disease

## 2020-07-14 NOTE — Telephone Encounter (Signed)
Ok with me.  Dr H 

## 2020-07-14 NOTE — Telephone Encounter (Signed)
Patient is requesting to switch providers from Dr. Allyson Sabal to Dr. Rennis Golden. Please advise.

## 2020-07-16 ENCOUNTER — Other Ambulatory Visit: Payer: Self-pay | Admitting: Internal Medicine

## 2020-07-16 DIAGNOSIS — R9431 Abnormal electrocardiogram [ECG] [EKG]: Secondary | ICD-10-CM

## 2020-07-16 NOTE — Telephone Encounter (Signed)
Fine with me

## 2020-07-17 ENCOUNTER — Other Ambulatory Visit: Payer: Self-pay | Admitting: *Deleted

## 2020-07-17 ENCOUNTER — Encounter: Payer: Self-pay | Admitting: *Deleted

## 2020-07-17 NOTE — Patient Instructions (Signed)
Goals Addressed              This Visit's Progress   .  [facility]) Eat Healthy        Follow Up Date 02/13/21   - limit fast food meals to no more than 1 per week - manage portion size - read food labels for fat, fiber, carbohydrates and portion size - set a realistic goal - switch to sugar-free drinks    Why is this important?   When you are ready to manage your nutrition or weight, having a plan and setting goals will help.  Taking small steps to change how you eat and exercise is a good place to start.    Notes:     .  [facility]) Learn More About My Health        Follow Up Date 10/15/20   - tell my story and reason for my visit - make a list of questions - ask questions - repeat what I heard to make sure I understand - bring a list of my medicines to the visit - speak up when I don't understand    Why is this important?   The best way to learn about your health and care is by talking to the doctor and nurse.  They will answer your questions and give you information in the way that you like best.    Notes:     .  [facility]) Make and Keep All Appointments        Follow Up Date 02/13/21   - call to cancel if needed - keep a calendar with appointment dates -attend appointments with primary care and cardiology    Why is this important?   Part of staying healthy is seeing the doctor for follow-up care.  If you forget your appointments, there are some things you can do to stay on track.    Notes:     .  COMPLETED: [facility]) Patient will report scheduling appointment with primary care provider within the next 31 days. (pt-stated)        CARE PLAN ENTRY (see longtitudinal plan of care for additional care plan information)  Objective:  Lab Results  Component Value Date   HGBA1C 12.6 (A) 02/03/2019 .   Lab Results  Component Value Date   CREATININE 1.04 02/03/2019   CREATININE 1.07 04/30/2018   CREATININE 0.96 12/21/2015 .   Marland Kitchen No results found for: EGFR  Current Barriers:   Marland Kitchen Knowledge Deficits related to basic Diabetes pathophysiology and self care/management . Knowledge Deficits related to medications used for management of diabetes . Lack of motivation to self care manage chronic medical conditions  Case Manager Clinical Goal(s):  Over the next 31 days, patient will demonstrate improved adherence to prescribed treatment plan for diabetes self care/management as evidenced by: scheduling/establishing primary care appointment and obtaining refills for prescribed medications . Verbalize daily monitoring and recording of CBG within 31 days . Verbalize adherence to ADA/ carb modified diet within the next 31 days . Verbalize adherence to prescribed medication regimen within the next 31 days  . Verbalize scheduling primary care appointment  Interventions:  . Provided education to patient about basic DM disease process . Reviewed medications with patient and discussed importance of medication adherence . Discussed plans with patient for ongoing care management follow up and provided patient with direct contact information for care management team . Reviewed scheduled/upcoming provider appointments including: missed Cardiology/Echocardiogram appointment 8/10 . Discussed importance of establishing primary care and  follow up appointments; offered to assist with scheduling primary care appointment (patient declines) . Discussed importance of maintaining medication refills and medication compliance  Patient Self Care Activities:  . Self administers oral medications as prescribed . Self administers insulin as prescribed . Checks blood sugars as prescribed and utilize hyper and hypoglycemia protocol as needed . Adheres to prescribed ADA/carb modified . Schedules primary care appointment and obtain prescription refills for medications  06/02/2020-patient still has not scheduled appointment with primary care provider and does not have prescription refills for medications,  states he will try to arrange appointment for next week     Resolving due to duplicate goals    .  [facility]) Set My Target A1C        Follow Up Date 10/15/20    - set target A1C; discuss with provider goal of A1C    Why is this important?   Your target A1C is decided together by you and your doctor.  It is based on several things like your age and other health issues.    Notes:     Marland Kitchen  ([place]) Monitor and Manage My Blood Sugar        Follow Up Date 02/13/21   - check blood sugar at prescribed times - check blood sugar if I feel it is too high or too low - enter blood sugar readings and medication or insulin into daily log - take the blood sugar log to all doctor visits - take the blood sugar meter to all doctor visits    Why is this important?   Checking your blood sugar at home helps to keep it from getting very high or very low.  Writing the results in a diary or log helps the doctor know how to care for you.  Your blood sugar log should have the time, date and the results.  Also, write down the amount of insulin or other medicine that you take.  Other information, like what you ate, exercise done and how you were feeling, will also be helpful.     Notes:

## 2020-07-17 NOTE — Patient Outreach (Signed)
[facility]) Care Management  07/17/2020  RADEN BYINGTON 1971/03/22 [id number]   RN Health Coach Monthly Outreach  Referral Date:02/05/2019 Referral Source:MD Referral Reason for Referral:Disease Management Education Insurance:[employer]   Outreach Attempt:  Successful telephone outreach to patient for follow up.  HIPAA verified with patient.  Patient reporting attending appointment with primary care provider, receiving medication prescriptions, and new CBG meter.  Reports compliance with all medications and states he is using the continuous blood glucose meter (needs to get a replacement, needle came out of arm).  Reports fasting blood sugars have been 120-140's and post eating blood sugars have ranged 170-190's.  Latest Hgb A1C is 11.8 on 06/22/2020.  Appointments:  Attended appointment with primary care provider in October.  Has scheduled appointment with Cardiology on 07/18/2020.  Plan: RN Health Coach will make next telephone outreach attempt within the month of January and patient agrees to future outreach.  Rhae Lerner RN [facility] Care Management  RN Health Coach [phone] [email or web address]

## 2020-07-18 ENCOUNTER — Ambulatory Visit (INDEPENDENT_AMBULATORY_CARE_PROVIDER_SITE_OTHER): Payer: 59 | Admitting: Cardiovascular Disease

## 2020-07-18 ENCOUNTER — Other Ambulatory Visit: Payer: Self-pay

## 2020-07-18 ENCOUNTER — Encounter: Payer: Self-pay | Admitting: Cardiovascular Disease

## 2020-07-18 VITALS — BP 108/74 | HR 87 | Ht 71.0 in | Wt 228.0 lb

## 2020-07-18 DIAGNOSIS — E785 Hyperlipidemia, unspecified: Secondary | ICD-10-CM | POA: Diagnosis not present

## 2020-07-18 DIAGNOSIS — I422 Other hypertrophic cardiomyopathy: Secondary | ICD-10-CM

## 2020-07-18 DIAGNOSIS — R011 Cardiac murmur, unspecified: Secondary | ICD-10-CM

## 2020-07-18 DIAGNOSIS — I10 Essential (primary) hypertension: Secondary | ICD-10-CM

## 2020-07-18 NOTE — Assessment & Plan Note (Signed)
History of cardiac murmur with 2D echo performed 03/05/2019 revealing normal LV systolic function with mild mild diastolic dysfunction in eighth 3.5 m/s outflow tract gradient which increased with Valsalva consistent with HOCM.  There is no stenosis of the aortic valve.  There was moderate LVH with proximal septal thickening.  There is no history of sudden cardiac death in his family.  I am going to get a cardiac MRI to further evaluate.

## 2020-07-18 NOTE — Assessment & Plan Note (Signed)
History of essential hypertension a blood pressure measured today 108/74.  He is on Benicar hydrochlorothiazide and metoprolol.

## 2020-07-18 NOTE — Assessment & Plan Note (Signed)
History of hyperlipidemia normally on atorvastatin 40 mg a day however he was not taking his medications as result his recent lipid profile performed 06/22/2020 revealed cholesterol 237, LDL 165 and HDL 49 which reflects this.  He is back on his atorvastatin.  We will recheck a lipid liver profile in 2 months.

## 2020-07-18 NOTE — Progress Notes (Signed)
07/18/2020 Howard Calhoun   01/01/1971  [id number]  Primary Physician Janith Lima, MD Primary Cardiologist: Lorretta Harp MD Lupe Carney, [place]  HPI:  Howard Calhoun is a 49 y.o.  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 Cathlean Cower for cardiovascular valuation because of an auscultated murmur.  I last saw him in the office 04/27/2019. His risk factors include treated diabetes, hypertension and hyperlipidemia.  Both his mother and father have ischemic heart disease and have had stents.  He is never had a heart attack or stroke.  He does get somewhat winded when playing with his 3 children.  His primary care physician auscultated a murmur and obtain a 2D echo cardiogram 6-19/20 revealing hyperdynamic left ventricle with asymmetric septal thickening and HOCM physiology with a gradient of 3.8 m/s.  Since I saw him a year ago he continues to do well.  He is completely asymptomatic specifically denying chest pain, shortness of breath or dizziness.  He did have a Myoview stress test performed 07/05/2020 showed an EF of about 50% with inferior thinning which looked like diaphragmatic attenuation although it was read as prior infarct.  He also was put back on his blood pressure medicines and has an excellent blood pressure today.  He had not been taking his atorvastatin and as result a recent lipid profile performed 06/22/2020 revealed total cholesterol 237, LDL of 165 and HDL 49.   Current Meds  Medication Sig  . atorvastatin (LIPITOR) 40 MG tablet Take 1 tablet (40 mg total) by mouth daily.  . Blood Glucose Monitoring Suppl (ONETOUCH VERIO FLEX SYSTEM) w/Device KIT 1 Act by Does not apply route 3 (three) times daily with meals.  . Cholecalciferol 1.25 MG (50000 UT) capsule Take 1 capsule (50,000 Units total) by mouth once a week.  . Continuous Blood Gluc Receiver (FREESTYLE  LIBRE 14 DAY READER) DEVI 1 applicator by Does not apply route daily.  . Continuous Blood Gluc Sensor (FREESTYLE LIBRE 14 DAY SENSOR) MISC 2 application by Does not apply route daily.  Marland Kitchen gabapentin (NEURONTIN) 300 MG capsule Take 1 capsule (300 mg total) by mouth 3 (three) times daily as needed (nerve pain).  Marland Kitchen glimepiride (AMARYL) 2 MG tablet TAKE 1 TABLET BY MOUTH  DAILY WITH BREAKFAST  . Glucagon (GVOKE HYPOPEN 2-PACK) 1 MG/0.2ML SOAJ Inject 1 Act into the skin daily as needed.  Marland Kitchen glucose blood (ONETOUCH VERIO) test strip Use TID  . Insulin Glargine (BASAGLAR KWIKPEN) 100 UNIT/ML Inject 60 Units into the skin daily.  . insulin lispro (HUMALOG KWIKPEN) 200 UNIT/ML KwikPen Inject 5 Units into the skin with breakfast, with lunch, and with evening meal.  . Insulin Pen Needle 32G X 4 MM MISC Use to inject insulin DX E11.8  . olmesartan-hydrochlorothiazide (BENICAR HCT) 40-25 MG tablet Take 1 tablet by mouth daily.  Marland Kitchen omeprazole (PRILOSEC) 40 MG capsule Take 1 capsule (40 mg total) by mouth daily.     Allergies  Allergen Reactions  . Metformin And Related Diarrhea    Social History   Socioeconomic History  . Marital status: Married    Spouse name: Not on file  . Number of children: 3  . Years of education: Not on file  . Highest education level: Not on file  Occupational History  . Occupation: Retail buyer  Tobacco Use  . Smoking status: Never Smoker  .  Smokeless tobacco: Never Used  Vaping Use  . Vaping Use: Never used  Substance and Sexual Activity  . Alcohol use: No    Comment: 1-2 a month  . Drug use: No  . Sexual activity: Yes    Birth control/protection: None  Other Topics Concern  . Not on file  Social History Narrative  . Not on file   Social Determinants of Health   Financial Resource Strain: Low Risk   . Difficulty of Paying Living Expenses: Not hard at all  Food Insecurity: No Food Insecurity  . Worried About Charity fundraiser in the Last Year: Never  true  . Ran Out of Food in the Last Year: Never true  Transportation Needs: No Transportation Needs  . Lack of Transportation (Medical): No  . Lack of Transportation (Non-Medical): No  Physical Activity:   . Days of Exercise per Week: Not on file  . Minutes of Exercise per Session: Not on file  Stress:   . Feeling of Stress : Not on file  Social Connections:   . Frequency of Communication with Friends and Family: Not on file  . Frequency of Social Gatherings with Friends and Family: Not on file  . Attends Religious Services: Not on file  . Active Member of Clubs or Organizations: Not on file  . Attends Archivist Meetings: Not on file  . Marital Status: Not on file  Intimate Partner Violence:   . Fear of Current or Ex-Partner: Not on file  . Emotionally Abused: Not on file  . Physically Abused: Not on file  . Sexually Abused: Not on file     Review of Systems: General: negative for chills, fever, night sweats or weight changes.  Cardiovascular: negative for chest pain, dyspnea on exertion, edema, orthopnea, palpitations, paroxysmal nocturnal dyspnea or shortness of breath Dermatological: negative for rash Respiratory: negative for cough or wheezing Urologic: negative for hematuria Abdominal: negative for nausea, vomiting, diarrhea, bright red blood per rectum, melena, or hematemesis Neurologic: negative for visual changes, syncope, or dizziness All other systems reviewed and are otherwise negative except as noted above.    Blood pressure 108/74, pulse 87, height $RemoveBe'5\' 11"'vAUcAKPcp$  (1.803 m), weight 228 lb (103.4 kg).  General appearance: alert and no distress Neck: no adenopathy, no carotid bruit, no JVD, supple, symmetrical, trachea midline and thyroid not enlarged, symmetric, no tenderness/mass/nodules Lungs: clear to auscultation bilaterally Heart: regular rate and rhythm, S1, S2 normal, no murmur, click, rub or gallop Extremities: extremities normal, atraumatic, no cyanosis  or edema Pulses: 2+ and symmetric Skin: Skin color, texture, turgor normal. No rashes or lesions Neurologic: Alert and oriented X 3, normal strength and tone. Normal symmetric reflexes. Normal coordination and gait  EKG not performed today  ASSESSMENT AND PLAN:   Essential hypertension History of essential hypertension a blood pressure measured today 108/74.  He is on Benicar hydrochlorothiazide and metoprolol.  Hyperlipidemia with target LDL less than 100 History of hyperlipidemia normally on atorvastatin 40 mg a day however he was not taking his medications as result his recent lipid profile performed 06/22/2020 revealed cholesterol 237, LDL 165 and HDL 49 which reflects this.  He is back on his atorvastatin.  We will recheck a lipid liver profile in 2 months.  Murmur, cardiac History of cardiac murmur with 2D echo performed 03/05/2019 revealing normal LV systolic function with mild mild diastolic dysfunction in eighth 3.5 m/s outflow tract gradient which increased with Valsalva consistent with HOCM.  There is no stenosis  of the aortic valve.  There was moderate LVH with proximal septal thickening.  There is no history of sudden cardiac death in his family.  I am going to get a cardiac MRI to further evaluate.      Lorretta Harp MD FACP,FACC,FAHA, [facility] 07/18/2020 10:08 AM

## 2020-07-18 NOTE — Patient Instructions (Signed)
Medication Instructions:  Your physician recommends that you continue on your current medications as directed. Please refer to the Current Medication list given to you today.  *If you need a refill on your cardiac medications before your next appointment, please call your pharmacy*   Lab Work: Please return for FASTING labs in 2 months (Lipid)  Our in office lab hours are Monday-Friday 8:00-4:00, closed for lunch 12:45-1:45 pm.  No appointment needed.   Testing/Procedures: Your physician has requested that you have a cardiac MRI. Cardiac MRI uses a computer to create images of your heart as its beating, producing both still and moving pictures of your heart and major blood vessels. For further information please visit [email or web address]. Please follow the instruction sheet given to you today for more information. --this must be approved by insurance prior to scheduling  Follow-Up: At [facility], you and your health needs are our priority.  As part of our continuing mission to provide you with exceptional heart care, we have created designated Provider Care Teams.  These Care Teams include your primary Cardiologist (physician) and Advanced Practice Providers (APPs -  Physician Assistants and Nurse Practitioners) who all work together to provide you with the care you need, when you need it.  We recommend signing up for the patient portal called "MyChart".  Sign up information is provided on this After Visit Summary.  MyChart is used to connect with patients for Virtual Visits (Telemedicine).  Patients are able to view lab/test results, encounter notes, upcoming appointments, etc.  Non-urgent messages can be sent to your provider as well.   To learn more about what you can do with MyChart, go to [email or web address].    Your next appointment:   12 month(s)  The format for your next appointment:   In Person  Provider:   Nanetta Batty, MD

## 2020-07-21 ENCOUNTER — Encounter: Payer: Self-pay | Admitting: Cardiovascular Disease

## 2020-07-21 NOTE — Telephone Encounter (Signed)
Left message for patient regarding appointment for Cardiac MRI scheduled Monday 08/14/20 at 11:00 am at [facility]---arrival time is 10:30 am-1st floor admissions office---will mail information to patient and it is also available in My Chart

## 2020-07-25 ENCOUNTER — Other Ambulatory Visit: Payer: Self-pay

## 2020-07-25 ENCOUNTER — Other Ambulatory Visit (HOSPITAL_COMMUNITY): Payer: 59

## 2020-07-25 DIAGNOSIS — I422 Other hypertrophic cardiomyopathy: Secondary | ICD-10-CM

## 2020-08-09 ENCOUNTER — Telehealth (HOSPITAL_COMMUNITY): Payer: Self-pay | Admitting: Emergency Medicine

## 2020-08-09 NOTE — Telephone Encounter (Signed)
Attempted to call patient regarding upcoming cardiac MR appointment. Left message on voicemail with name and callback number Adiya Selmer RN Navigator Cardiac Imaging [place] [facility] [phone] Office [phone] Cell  

## 2020-08-14 ENCOUNTER — Ambulatory Visit (HOSPITAL_COMMUNITY): Admission: RE | Admit: 2020-08-14 | Payer: 59 | Source: Ambulatory Visit

## 2020-08-14 ENCOUNTER — Other Ambulatory Visit (HOSPITAL_COMMUNITY): Payer: 59

## 2020-08-29 ENCOUNTER — Encounter: Payer: Self-pay | Admitting: Cardiovascular Disease

## 2020-08-29 ENCOUNTER — Telehealth: Payer: Self-pay | Admitting: Cardiovascular Disease

## 2020-08-29 NOTE — Telephone Encounter (Signed)
Left message regarding appointment scheduled for the Cardiac MRI ordered by Dr. Laretta Alstrom Wednesday 09/27/20 at 8:00 am at [facility]---arrival time is 7:30 am--1st floor admissions office for check in---will mail information to patient and requested he call with questions or concerns.

## 2020-09-11 ENCOUNTER — Ambulatory Visit (HOSPITAL_COMMUNITY): Payer: 59

## 2020-09-19 ENCOUNTER — Other Ambulatory Visit: Payer: Self-pay | Admitting: Internal Medicine

## 2020-09-19 DIAGNOSIS — E118 Type 2 diabetes mellitus with unspecified complications: Secondary | ICD-10-CM

## 2020-09-21 ENCOUNTER — Other Ambulatory Visit: Payer: 59

## 2020-09-25 ENCOUNTER — Telehealth (HOSPITAL_COMMUNITY): Payer: Self-pay | Admitting: Emergency Medicine

## 2020-09-25 NOTE — Telephone Encounter (Signed)
Reaching out to patient to offer assistance regarding upcoming cardiac imaging study; pt verbalizes understanding of appt date/time, parking situation and where to check in,and verified current allergies; name and call back number provided for further questions should they arise Supriya Beaston RN Navigator Cardiac Imaging [facility] [phone] office [phone] cell   Pt denies claustro, denies implants 

## 2020-09-26 ENCOUNTER — Other Ambulatory Visit: Payer: Self-pay | Admitting: *Deleted

## 2020-09-26 ENCOUNTER — Telehealth (HOSPITAL_COMMUNITY): Payer: Self-pay | Admitting: Emergency Medicine

## 2020-09-26 NOTE — Telephone Encounter (Signed)
Pt calling to provide updated insurance info for appt tomorrow 09/27/20.   I gave him 2 phone numbers to reach someone that can help him  [phone] billing dept [phone] pre-service center  He was unable to reach someone, therefore cancelling appt so he isnt stuck with a hefty bill.  Referral/appt desk notes updated- insur changed on 09/16/20; patient attempted to call pre-service center and [place] billing department to update info without success. please r/s after new insur info is obtained- Donicia Druck w  Rockwell Alexandria RN Navigator Cardiac Imaging [facility] [phone] Office  [phone] Cell

## 2020-09-26 NOTE — Patient Outreach (Signed)
[facility]) Care Management  09/26/2020  Howard Calhoun 1971-03-12 [id number]  Unsuccessful outreach attempt made to patient. RN Health Coach left HIPAA compliant voicemail message along with her contact information.  Plan: RN Health Coach will call patient within the month of February.   Blanchie Serve RN, BSN [facility] Care Management  RN Health Coach [phone] [email or web address]

## 2020-09-26 NOTE — Telephone Encounter (Signed)
So is there anything that we have to do?

## 2020-09-27 ENCOUNTER — Ambulatory Visit: Payer: 59 | Admitting: *Deleted

## 2020-09-27 ENCOUNTER — Ambulatory Visit (HOSPITAL_COMMUNITY): Admission: RE | Admit: 2020-09-27 | Payer: No Typology Code available for payment source | Source: Ambulatory Visit

## 2020-09-27 NOTE — Telephone Encounter (Signed)
Appreciate that [place] Dec!!!

## 2020-10-04 ENCOUNTER — Other Ambulatory Visit (HOSPITAL_COMMUNITY): Payer: No Typology Code available for payment source

## 2020-10-05 ENCOUNTER — Telehealth: Payer: Self-pay | Admitting: Cardiovascular Disease

## 2020-10-05 ENCOUNTER — Encounter: Payer: Self-pay | Admitting: Cardiovascular Disease

## 2020-10-05 NOTE — Telephone Encounter (Signed)
Left message regarding new appointment date and time for the Cardiac MRI ordered by Dr. Laretta Alstrom Friday 10/24/20 at 8:00 am--arrival time is 7:30 am 1st floor admissions office at [facility] for check in.  Will mail information to patient and it is available in My Chart.  Requested patient call with questions or concerns.

## 2020-10-13 ENCOUNTER — Encounter: Payer: Self-pay | Admitting: Internal Medicine

## 2020-10-24 ENCOUNTER — Encounter (HOSPITAL_COMMUNITY): Payer: Self-pay | Admitting: Cardiovascular Disease

## 2020-10-24 ENCOUNTER — Ambulatory Visit (HOSPITAL_COMMUNITY): Payer: No Typology Code available for payment source | Attending: Internal Medicine

## 2020-11-01 ENCOUNTER — Telehealth (HOSPITAL_COMMUNITY): Payer: Self-pay | Admitting: Emergency Medicine

## 2020-11-01 NOTE — Telephone Encounter (Signed)
Reaching out to patient to offer assistance regarding upcoming cardiac imaging study; pt verbalizes understanding of appt date/time, parking situation and where to check in,and verified current allergies; name and call back number provided for further questions should they arise Rockwell Alexandria RN Navigator Cardiac Imaging [facility] [phone] office [phone] cell  Pt denies implants, denies claustro Pt states he is due to change his freestyle libre device so I told him to wait til test is done [place]

## 2020-11-03 ENCOUNTER — Ambulatory Visit (HOSPITAL_COMMUNITY)
Admission: RE | Admit: 2020-11-03 | Discharge: 2020-11-03 | Disposition: A | Discharge status: Home / Self Care | Payer: No Typology Code available for payment source | Source: Ambulatory Visit | Attending: Cardiovascular Disease | Admitting: Cardiovascular Disease

## 2020-11-03 ENCOUNTER — Telehealth (HOSPITAL_COMMUNITY): Payer: Self-pay | Admitting: Cardiovascular Disease

## 2020-11-03 ENCOUNTER — Other Ambulatory Visit: Payer: Self-pay

## 2020-11-03 DIAGNOSIS — I422 Other hypertrophic cardiomyopathy: Secondary | ICD-10-CM

## 2020-11-03 MED ORDER — GADOBUTROL 1 MMOL/ML IV SOLN
10.0000 mL | Freq: Once | INTRAVENOUS | Status: AC | PRN
  Administered 2020-11-03: 10 mL via INTRAVENOUS

## 2020-11-03 NOTE — Telephone Encounter (Signed)
Just an FYI. We have made several attempts to contact this patient including sending a letter to schedule or reschedule their echocardiogram. We will be removing the patient from the echo WQ.   10/24/20 NO SHOWED- MAILED LETTER [facility]  Pt cancelled 10/04/20, 09/11/20,08/14/20,07/25/20     Thank you

## 2020-11-07 ENCOUNTER — Other Ambulatory Visit: Payer: Self-pay | Admitting: *Deleted

## 2020-11-07 NOTE — Patient Outreach (Addendum)
[facility]) Care Management  11/07/2020  Howard Calhoun 07/30/1971 [id number]  Unsuccessful outreach attempt made to patient. RN Health Coach left HIPAA compliant voicemail message along with her contact information.  Plan: RN Health Coach will call patient within the month of March.  Blanchie Serve RN, BSN [facility] Care Management  RN Health Coach [phone] [name]

## 2020-11-08 ENCOUNTER — Telehealth: Payer: Self-pay | Admitting: Internal Medicine

## 2020-11-08 DIAGNOSIS — I421 Obstructive hypertrophic cardiomyopathy: Secondary | ICD-10-CM

## 2020-11-08 NOTE — Telephone Encounter (Signed)
Called patient and confirmed DOB.  Discussed his DOE with exertion playing with his 48 year old.  Discussed HOCM and that this could be related to flow obstruction.   - Would recommend exercise  stress echo (NPO at midnight); discussed risks, benefits, and alternatives of the diagnostic procedure including chest pain, arrhythmia, and death.  Patient amenable for testing.   Test had been ordered; attestation has be placed.  Patient has no further questions or concerns.  Christell Constant, MD

## 2020-11-10 ENCOUNTER — Other Ambulatory Visit: Payer: Self-pay

## 2020-11-10 ENCOUNTER — Encounter: Payer: Self-pay | Admitting: Radiology

## 2020-11-10 ENCOUNTER — Ambulatory Visit: Payer: No Typology Code available for payment source

## 2020-11-10 DIAGNOSIS — I421 Obstructive hypertrophic cardiomyopathy: Secondary | ICD-10-CM

## 2020-11-10 NOTE — Progress Notes (Signed)
Orders placed for 2 week zio, blood work and referral to geneticist.

## 2020-11-10 NOTE — Progress Notes (Signed)
Enrolled patient for a 14 day Zio XT  monitor to be mailed to patients home  °

## 2020-11-23 ENCOUNTER — Ambulatory Visit: Payer: No Typology Code available for payment source | Admitting: Genetic Counselor

## 2020-11-23 ENCOUNTER — Other Ambulatory Visit: Payer: Self-pay

## 2020-12-01 ENCOUNTER — Other Ambulatory Visit: Payer: Self-pay | Admitting: Internal Medicine

## 2020-12-01 DIAGNOSIS — E785 Hyperlipidemia, unspecified: Secondary | ICD-10-CM

## 2020-12-01 DIAGNOSIS — I10 Essential (primary) hypertension: Secondary | ICD-10-CM

## 2020-12-01 DIAGNOSIS — E118 Type 2 diabetes mellitus with unspecified complications: Secondary | ICD-10-CM

## 2020-12-01 DIAGNOSIS — K2100 Gastro-esophageal reflux disease with esophagitis, without bleeding: Secondary | ICD-10-CM

## 2020-12-02 ENCOUNTER — Other Ambulatory Visit (HOSPITAL_COMMUNITY)
Admission: RE | Admit: 2020-12-02 | Discharge: 2020-12-02 | Disposition: A | Discharge status: Home / Self Care | Payer: No Typology Code available for payment source | Source: Ambulatory Visit | Attending: Internal Medicine | Admitting: Internal Medicine

## 2020-12-02 DIAGNOSIS — Z20822 Contact with and (suspected) exposure to covid-19: Secondary | ICD-10-CM | POA: Insufficient documentation

## 2020-12-02 DIAGNOSIS — Z01812 Encounter for preprocedural laboratory examination: Secondary | ICD-10-CM | POA: Insufficient documentation

## 2020-12-02 LAB — SARS CORONAVIRUS 2 (TAT 6-24 HRS): SARS Coronavirus 2: NEGATIVE

## 2020-12-04 ENCOUNTER — Telehealth (HOSPITAL_COMMUNITY): Payer: Self-pay

## 2020-12-04 NOTE — Progress Notes (Signed)
Pre Test GC  Referring Provider: Nanetta Batty, MD  Referral Reason  Howard Calhoun was referred for genetic consult and testing of hypertrophic cardiomyopathy subsequent to a recent cardiac imaging that detected wall thickness suggestive of HCM.  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 He reports recognizing increased fatigue and lowered stamina over the last 2 years while playing with his son. He was found to have a heart murmur in June, 2021 and had an abnormal EKG. He was referred to Dr. Allyson Calhoun for cardiac follow up and underwent a cardiac MRI that detected cardiac wall thickness (IVS) of 2 cm EF of 54% and no scar burden. He does report having occasional dyspnea, and a racing heart.  He tells me of having an episode of dizziness while driving to work and had to lay down on the office floor to recover. Denies syncope.  Traditional Risk Factors Howard Calhoun reports being diagnosed with HTN and states that it is well-controlled with medication.   Family history Howard Calhoun (III.1) has twin daughters (fraternal), ages 51 (IV.1, IV.20) and a 22 year old son (IV.3). HIS kids have not been screened for HCM but one of his daughters was found to have a heart murmur at age 21 (IV.2).  Howard Calhoun has a younger healthy half-sister, age 67 (not indicated in pedigree).  Howard Calhoun' father (II.5) underwent kidney dialysis at 99. He has diabetes, hypertension, hypercholesterolemia and has CAD with stents. There are no reports of heart disease amongst his siblings (II.1-II.4) or his parents (I.1, I.2).  Howard Calhoun' mother (II.6) died at 47 from lung cancer. She too had CAD with stents at age 10 and underwent regular checkups. No history of heart disease amongst her siblings (II.7-II.11). His maternal grandmother (I.4) died suddenly at age 23. She went upstairs to use the bathroom and the family reports  hearing a loud noise. Upon going upstairs they found her lying dead o the floor. An autopsy was not performed, but the family was told that she died of a heart attack.  Genetics Howard Calhoun was counseled on the genetics of hypertrophic cardiomyopathy (HCM). I explained to him that this is an autosomal dominant condition and hence his children have a 50% chance of inheriting HCM. I explained to him that his first degree-relatives, namely his son and daughters seek regular surveillance for HCM.  Clinical screening involves echocardiogram and EKG at regular intervals, frequency is typically determined by age, with those over the age of 59 getting screened every 3- 5 years and, children under the age of 52 are screened annually. He verbalized understanding of this.   I discussed penetrance of HCM being incomplete i.e. not all individuals harboring a HCM mutation will present clinically with HCM, and age-related penetrance where clinical presentation of HCM increases with advanced age. While there is no significant history of cardiomyopathy or sudden death in his first-degree relatives, he does report sudden death in his maternal grandmother at a young age. At this time it is not clear if he has a de novo mutation for HCM or if there is incomplete penetrance for HCM in his mother. I also reviewed variable expression of HCM and emphasized that this condition can express at any age at level of severity in the family. Hence, it is important for his first-degree relatives to stay vigilant and seek regular surveillance for HCM. He verbalized understanding of this. I informed him that some  patients - about 8-10% can have compound and digenic mutations for HCM. Also briefly discussed the inheritance pattern and treatment /management plans for the infiltrative cardiomyopathies that present as HCM phenocopies.   We walked through the process of genetic testing. I explained to him that genetic testing is a probabilistic test  dependent upon age and severity of presentation, presence of risk factors for HCM and importantly family history of HCM or sudden death in first-degree relatives. He verbalized understanding of this.  The potential outcomes of genetic testing and subsequent management of at-risk family members were discussed so as to manage expectations-  I explained to him that if a mutation is not identified, then all first-degree relatives should regular screening for HCM. I emphasized that even if the genetic test is negative, it does not mean that he does not have HCM. A negative test result can be due to limitations of the genetic test.   There is also the likelihood of identifying a "Variant of unknown significance". This result means that the variant has not been detected in a statistically significant number of HCM patients and/or functional studies have not been performed to verify its pathogenicity. This VUS can be tested in the family to see if it segregates with disease. If a VUS is found, first-degree relatives should undergo regular clinical screening for HCM.  If a pathogenic variant is reported, then his first-degree family members can get tested for this variant. If they test positive, it is likely they will develop HCM. In light of variable expression and incomplete penetrance associated with HCM, it is not possible to predict when they will manifest clinically with HCM. It is recommended that family members that test positive for the familial pathogenic variant pursue clinical screening for HCM. Family members that test negative for the familial mutation need not pursue periodic screening for HCM, but seek care if the symptoms develop.   Impression  Howard Calhoun is symptomatic and was found to have cardiac wall thickness indicative of HCM at age 67. Additionally, he reports sudden death in his maternal grandmother. Considering his early age of presentation and severity in the absence of risk factors, as well  as the family history of sudden death, although in a second-degree relative, it is highly likely that he has a genetic condition.  Genetic testing for HCM is recommended. This test should include the major genes for HCM as well as the HCM phenocopies of Fabry disease, Danon disease, Wolf-Parkinson White syndrome and FTA. Genetic testing will confirm if he has a sarcomeric gene mutation or a HCM phenocopy.  In addition, we discussed the protections afforded by the Genetic Information Non-Discrimination Act (GINA). I explained to him that GINA protects him from losing his employment or health insurance based on his genotype. However, these protections do not cover life insurance and disability. He verbalized understanding of this and states that his twin daughters have life insurance, not his son.  Please note that the patient has not been counseled in this visit on personal, cultural or ethical issues that he may face due to his heart condition.   Plan After a thorough discussion of the risk and benefits of genetic testing for HCM, Edon states his intent to pursue genetic testing for HCM and signed the informed consent form. Blood was drawn today for testing.    Sidney Ace, Ph.D, [facility] Clinical Molecular Geneticist

## 2020-12-04 NOTE — Telephone Encounter (Signed)
Detailed instructions left for the patient on his answering machine. Asked to call back with any questions. S.Williams EMTP 

## 2020-12-05 ENCOUNTER — Ambulatory Visit (HOSPITAL_COMMUNITY): Payer: No Typology Code available for payment source

## 2020-12-05 ENCOUNTER — Other Ambulatory Visit: Payer: Self-pay

## 2020-12-05 ENCOUNTER — Ambulatory Visit (HOSPITAL_COMMUNITY): Payer: No Typology Code available for payment source | Attending: Cardiovascular Disease

## 2020-12-05 DIAGNOSIS — I421 Obstructive hypertrophic cardiomyopathy: Secondary | ICD-10-CM | POA: Insufficient documentation

## 2020-12-05 LAB — ECHOCARDIOGRAM STRESS TEST
Area-P 1/2: 3.99 cm2
S' Lateral: 2.6 cm

## 2020-12-08 ENCOUNTER — Ambulatory Visit: Payer: No Typology Code available for payment source | Admitting: General Practice

## 2020-12-13 ENCOUNTER — Other Ambulatory Visit: Payer: Self-pay | Admitting: *Deleted

## 2020-12-13 NOTE — Patient Outreach (Signed)
[facility]) Care Management  12/13/2020  Howard Calhoun 30-Apr-1971 [id number]  Unsuccessful outreach attempt made to patient. Patient answered the phone and stated that he would not be able to speak today. He did request that this nurse call back at a later date.   Plan: RN Health Coach will call patient within the month of April.   Blanchie Serve RN, BSN [facility] Care Management  RN Health Coach [phone] [email or web address]

## 2020-12-15 ENCOUNTER — Telehealth: Payer: Self-pay

## 2020-12-15 NOTE — Telephone Encounter (Signed)
Called pt to set up OV with Dr. Allyson Sabal. While on the phone pt also mentions some swelling that he is noticing. Pt states that it is happening almost everyday. Pt states that he works sits a fair amount at work and it is usually in the evening that he notices the swelling. Pt states that it usually goes down when he gets home and able to put his feet up.  Will have PharmD and Dr. Allyson Sabal review meds/chart for any indication to cause for swelling.  Able to set up OV with pt. Pt verbalizes understanding.

## 2020-12-15 NOTE — Telephone Encounter (Signed)
I've reviewed meds and don't see an issue that would contribute to LE edema. Have him see an APP back to assess need for a diuretic

## 2020-12-18 ENCOUNTER — Other Ambulatory Visit: Payer: Self-pay | Admitting: *Deleted

## 2020-12-18 NOTE — Patient Outreach (Signed)
[facility]) Care Management  12/18/2020  TERI DILTZ 10-08-1970 [id number]  Unsuccessful outreach attempt made to patient. RN Health Coach left HIPAA compliant voicemail message along with her contact information.  Plan: RN Health Coach will call patient within the month of May.  Blanchie Serve RN, BSN [facility] Care Management  RN Health Coach [phone] [email or web address]

## 2021-01-16 ENCOUNTER — Other Ambulatory Visit: Payer: Self-pay | Admitting: *Deleted

## 2021-01-16 NOTE — Patient Outreach (Signed)
[facility]) Care Management  01/16/2021  Howard Calhoun Oct 04, 1970 [id number]  Unsuccessful outreach attempt made to patient. Patient answered the phone and stated that he would not be able to speak today. He did request that this nurse call back at a later date.   Plan: RN Health Coach will call patient within the month of June.  Blanchie Serve RN, BSN [facility] Care Management  RN Health Coach [phone] [email or web address]

## 2021-01-19 ENCOUNTER — Ambulatory Visit: Payer: No Typology Code available for payment source | Admitting: Cardiovascular Disease

## 2021-01-23 ENCOUNTER — Ambulatory Visit: Payer: Self-pay | Admitting: *Deleted

## 2021-01-25 ENCOUNTER — Ambulatory Visit: Payer: Self-pay | Admitting: *Deleted

## 2021-02-19 ENCOUNTER — Other Ambulatory Visit: Payer: Self-pay | Admitting: *Deleted

## 2021-02-19 NOTE — Patient Outreach (Signed)
[facility]) Care Management  02/19/2021  Howard Calhoun September 14, 1971 [id number]  Telephone outreach call to patient. HIPAA identifiers obtained. Patient answered the phone and stated that he had a few minutes to talk before he had to pick up his daughter. Patient explained that he does want to improve his diabetes and health. Adding that recently due to his father's health issues, work, and family that he has not been focused on taking care of his health. Patient explained that he is going to get the FreeStyle Libre (CGM) which will allow him to monitor his blood sugar closely. Patient states that he does want this nurse to send him diabetes education and a planning healthy meals booklet. He further explained that due to his busy schedule it is difficult to find time to discuss his health issues. Adding that perhaps the morning around 0930 may work better for this nurse to call. Nurse stated that she would send the education to the patient and call him in several more weeks at 0930 to discuss his diabetes and health. Patient agreed with this plan and stated that he had to end the conversation.  Plan: Nurse will send the patient diabetes education and a planning healthy meals booklet and will call the patient within the month of June.   Blanchie Serve RN, Mining engineer [facility] [phone] [email or web address]

## 2021-03-09 ENCOUNTER — Other Ambulatory Visit: Payer: Self-pay | Admitting: *Deleted

## 2021-03-09 NOTE — Patient Outreach (Signed)
[facility]) Care Management  03/09/2021  Howard Calhoun [birth year] [id number]  Unsuccessful outreach attempt made to patient. RN Health Coach left HIPAA compliant voicemail message along with her contact information.  Plan: RN Health Coach will call patient within the month of July.  Blanchie Serve RN, BSN [facility] Care Management  RN Health Coach [phone] [email or web address]

## 2021-04-11 ENCOUNTER — Other Ambulatory Visit: Payer: Self-pay | Admitting: *Deleted

## 2021-04-11 NOTE — Patient Outreach (Signed)
[facility]) Care Management  04/11/2021  Howard Calhoun Jan 04, 1971 [id number]  Unsuccessful outreach attempt # 8 made to patient. RN Health Coach left HIPAA compliant voicemail message along with her contact information.  Plan: RN Health Coach will call patient within the month of September and will send an unsuccessful letter.  Blanchie Serve RN, BSN [facility] Care Management  RN Health Coach [phone] [email or web address] [email or web address]

## 2021-05-17 ENCOUNTER — Other Ambulatory Visit: Payer: Self-pay | Admitting: *Deleted

## 2021-05-17 NOTE — Patient Outreach (Addendum)
[facility]) Care Management  05/17/2021  BADER STUBBLEFIELD 1971-07-31 [id number]  Unsuccessful outreach attempt made to patient. RN Health Coach left HIPAA compliant voicemail message along with her contact information.  Plan: RN Health Coach will call patient within the month of October and will send patient an unsuccessful letter.  Blanchie Serve RN, BSN [facility] Care Management  RN Health Coach [phone] [email or web address] [email or web address]

## 2021-05-18 ENCOUNTER — Ambulatory Visit: Payer: Self-pay | Admitting: *Deleted

## 2021-07-10 ENCOUNTER — Other Ambulatory Visit: Payer: Self-pay | Admitting: *Deleted

## 2021-07-10 NOTE — Patient Outreach (Signed)
[facility]) Care Management  07/10/2021  Howard Calhoun December 28, 1970 [id number]  Nurse Health Coach will perform case closure due to patient no longer being eligible for the [facility] program.   Plan: RN Health Coach will close case and will send PCP and patient a case closed letter.   Blanchie Serve RN, BSN [facility] Care Management  RN Health Coach [phone] Aaron [email or web address]

## 2022-10-06 IMAGING — MR MR CARD MORPHOLOGY WO/W CM
45 of 48 series · 45 of 48 positions shown · IV contrast (Contrast agent)
Comparison: none

CLINICAL DATA: Clinical question of hypertrophic cardiomyopathy
49 year old African American Male

Study assume HCT
EXAM:
CARDIAC MRI
TECHNIQUE: The patient was scanned on a 1.5 Tesla GE magnet. A dedicated
cardiac coil was used. Functional imaging was done using Fiesta
sequences. [DATE], and 4 chamber views were done to assess for RWMA's.
Modified Aujla rule using a short axis stack was used to
calculate an ejection fraction on a dedicated work station using
Circle software. The patient received 10 cc of Gadavist. After 10
minutes inversion recovery sequences were used to assess for
infiltration and scar tissue.
CONTRAST:  10 cc  of Gadavist

[Series 4: [id number]_bh · axial · 8.0mm · 1.41mm/px · 1 of 16 slices shown]
[im 1/16]
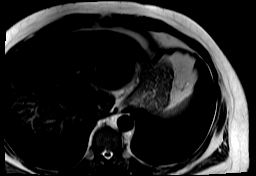

[Series 8: bSSFP · oblique · 8.0mm · 1.61mm/px · 1 of 25 slices shown (1 of 21)]
[im 1/25]
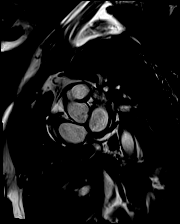

[Series 9: bSSFP · oblique · 8.0mm · 1.61mm/px · 1 of 25 slices shown (2 of 21)]
[im 1/25]
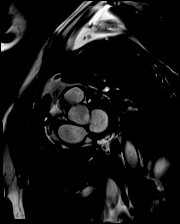

[Series 10: bSSFP · oblique · 8.0mm · 1.61mm/px · 1 of 25 slices shown (3 of 21)]
[im 1/25]
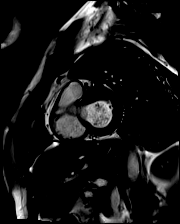

[Series 11: bSSFP · oblique · 8.0mm · 1.61mm/px · 1 of 25 slices shown (4 of 21)]
[im 1/25]
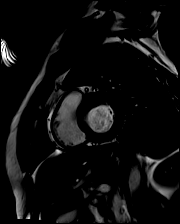

[Series 12: bSSFP · oblique · 8.0mm · 1.61mm/px · 1 of 25 slices shown (5 of 21)]
[im 1/25]
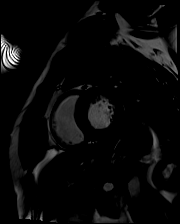

[Series 13: bSSFP · oblique · 8.0mm · 1.61mm/px · 1 of 25 slices shown (6 of 21)]
[im 1/25]
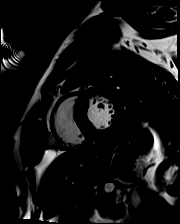

[Series 14: bSSFP · oblique · 8.0mm · 1.61mm/px · 1 of 25 slices shown (7 of 21)]
[im 1/25]
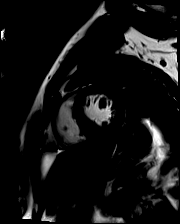

[Series 15: bSSFP · oblique · 8.0mm · 1.61mm/px · 1 of 25 slices shown (8 of 21)]
[im 1/25]
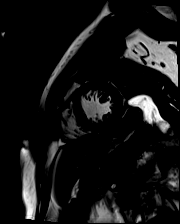

[Series 16: bSSFP · oblique · 8.0mm · 1.61mm/px · 1 of 25 slices shown (9 of 21)]
[im 1/25]
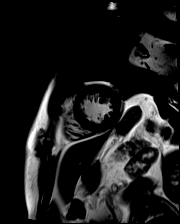

[Series 17: bSSFP · oblique · 8.0mm · 1.61mm/px · 1 of 25 slices shown (10 of 21)]
[im 1/25]
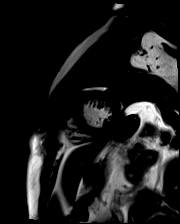

[Series 18: bSSFP · oblique · 8.0mm · 1.61mm/px · 1 of 25 slices shown (11 of 21)]
[im 1/25]
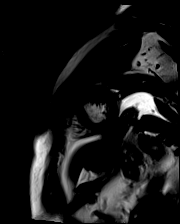

[Series 19: bSSFP · oblique · 8.0mm · 1.61mm/px · 1 of 25 slices shown (12 of 21)]
[im 1/25]
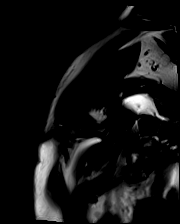

[Series 20: bSSFP · oblique · 8.0mm · 1.61mm/px · 1 of 25 slices shown (13 of 21)]
[im 1/25]
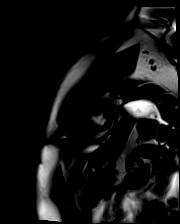

[Series 21: bSSFP · oblique · 8.0mm · 1.61mm/px · 1 of 25 slices shown (14 of 21)]
[im 1/25]
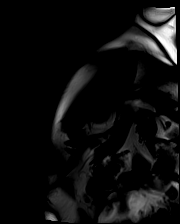

[Series 22: bSSFP · oblique · 8.0mm · 1.61mm/px · 1 of 25 slices shown (15 of 21)]
[im 1/25]
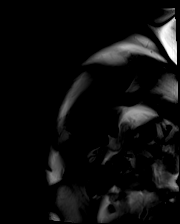

[Series 23: bSSFP · oblique · 8.0mm · 1.61mm/px · 1 of 25 slices shown (16 of 21)]
[im 1/25]
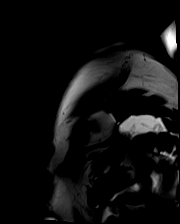

[Series 24: bSSFP · oblique · 8.0mm · 1.61mm/px · 1 of 25 slices shown (17 of 21)]
[im 1/25]
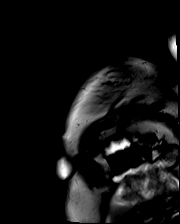

[Series 25: bSSFP · oblique · 6.0mm · 1.41mm/px · 1 of 25 slices shown (18 of 21)]
[im 1/25]
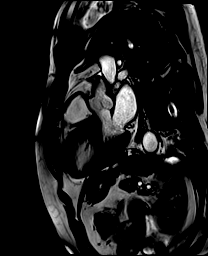

[Series 26: bSSFP · sagittal · 6.0mm · 1.41mm/px · 1 of 25 slices shown (19 of 21)]
[im 1/25]
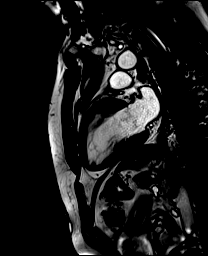

[Series 27: bSSFP · oblique · 6.0mm · 1.41mm/px · 1 of 25 slices shown (20 of 21)]
[im 1/25]
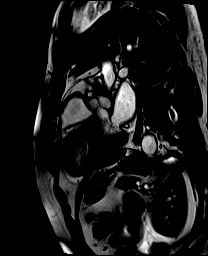

[Series 28: bSSFP · axial · 6.0mm · 1.41mm/px · 1 of 25 slices shown (21 of 21)]
[im 1/25]
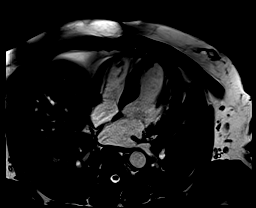

[Series 29: (id)_long_t1 · oblique · 8.0mm · 1.56mm/px · 1 of 24 slices shown]
[im 1/24]
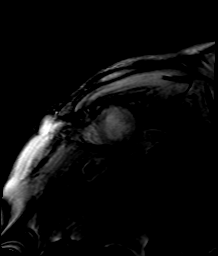

[Series 30: (id)_long_t1_moco · oblique · 8.0mm · 1.56mm/px · 1 of 24 slices shown]
[im 1/24]
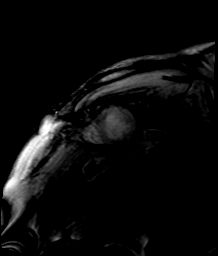

[Series 31: (id)_long_t1_moco_t1 · oblique · 8.0mm · 1.56mm/px · 1 of 6 slices shown]
[im 1/6]
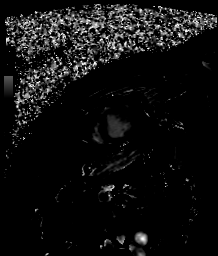

[Series 33: (id)_trufi · oblique · 8.0mm · 2.08mm/px · 1 of 9 slices shown]
[im 1/9]
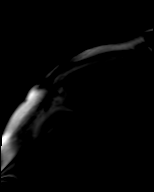

[Series 34: (id)_trufi_moco · oblique · 8.0mm · 2.08mm/px · 1 of 9 slices shown]
[im 1/9]
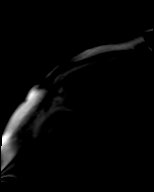

[Series 35: (id)_trufi_moco_t2 · oblique · 8.0mm · 2.08mm/px · 1 of 3 slices shown]
[im 1/3]
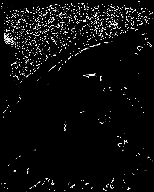

[Series 37: pre short axis · oblique · non-contrast · 8.0mm · 2.25mm/px · 1 of 10 slices shown (1 of 12)]
[im 1/10]
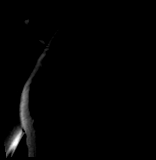

[Series 38: pre short axis · oblique · non-contrast · 8.0mm · 2.25mm/px · 1 of 10 slices shown (2 of 12)]
[im 1/10]
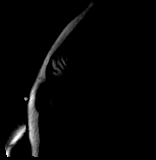

[Series 39: pre short axis · oblique · non-contrast · 8.0mm · 2.25mm/px · 1 of 10 slices shown (3 of 12)]
[im 1/10]
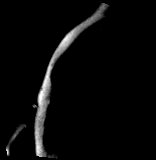

[Series 40: pre short axis · oblique · non-contrast · 8.0mm · 2.25mm/px · 1 of 10 slices shown (4 of 12)]
[im 1/10]
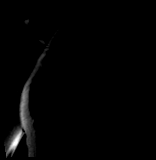

[Series 41: pre short axis · oblique · non-contrast · 8.0mm · 2.25mm/px · 1 of 10 slices shown (5 of 12)]
[im 1/10]
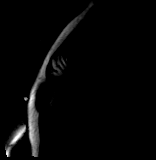

[Series 42: pre short axis · oblique · non-contrast · 8.0mm · 2.25mm/px · 1 of 10 slices shown (6 of 12)]
[im 1/10]
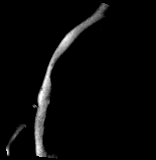

[Series 43: pre short axis · oblique · non-contrast · 8.0mm · 2.25mm/px · 1 of 10 slices shown (7 of 12)]
[im 1/10]
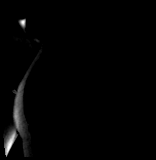

[Series 44: pre short axis · oblique · non-contrast · 8.0mm · 2.25mm/px · 1 of 10 slices shown (8 of 12)]
[im 1/10]
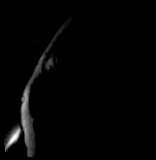

[Series 45: pre short axis · oblique · non-contrast · 8.0mm · 2.25mm/px · 1 of 10 slices shown (9 of 12)]
[im 1/10]
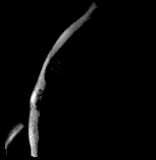

[Series 46: pre short axis · oblique · non-contrast · 8.0mm · 2.25mm/px · 1 of 10 slices shown (10 of 12)]
[im 1/10]
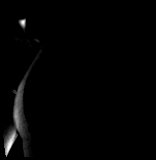

[Series 47: pre short axis · oblique · non-contrast · 8.0mm · 2.25mm/px · 1 of 10 slices shown (11 of 12)]
[im 1/10]
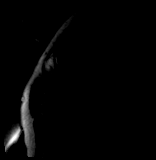

[Series 48: pre short axis · oblique · non-contrast · 8.0mm · 2.25mm/px · 1 of 10 slices shown (12 of 12)]
[im 1/10]
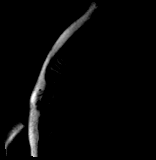

[Series 49: rest short axis · oblique · 8.0mm · 2.38mm/px · 1 of 80 slices shown (1 of 5)]
[im 1/80]
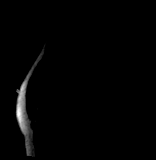

[Series 50: rest short axis · oblique · 8.0mm · 2.38mm/px · 1 of 80 slices shown (2 of 5)]
[im 1/80]
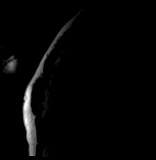

[Series 51: rest short axis · oblique · 8.0mm · 2.38mm/px · 1 of 80 slices shown (3 of 5)]
[im 1/80]
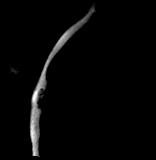

[Series 52: rest short axis · oblique · 8.0mm · 2.38mm/px · 1 of 80 slices shown (4 of 5)]
[im 1/80]
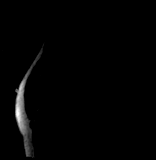

[Series 53: rest short axis · oblique · 8.0mm · 2.38mm/px · 1 of 80 slices shown (5 of 5)]
[im 1/80]
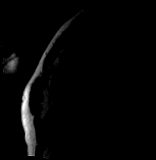

[45 of 48 positions shown; findings below may reference images not displayed]

FINDINGS: 1. Normal left ventricular size, with LVEDD 44 mm, and LVEDVi 76
mL/m2.

Mild increase in left ventricular thickness by myocardial mass index
(99 g/m2), with intraventricular septal thickness of 20 mm,
posterior wall thickness of 14 mm, but with septal to posterior
ratio < 1.5.

Low normal left ventricular systolic function (LVEF =54%). There are
no regional wall motion abnormalities.

Left ventricular parametric mapping notable for increase in both T2
and ECV signal in the basal anterior and inferior septum: 55 ms and
40% at greatest (respectively).

There is no late gadolinium enhancement in the left ventricular
myocardium; quantification protocol not needed.

2. Normal right ventricular size with RVEDVI 72 mL/m2.

Normal right ventricular thickness.

Normal right ventricular systolic function (RVEF =56%). There are no
regional wall motion abnormalities or aneurysms.

3. Normal left and right atrial size, with LAESV 28 mL/cm2 and RAESV
16 mL/cm2.

4. Normal size of the aortic root, ascending aorta and pulmonary
artery.

5. Presence of systolic anterior motion of the mitral valve with
associated mild mitral regurgitation. No velocity encoding aliasing
observed suggesting relatively low obstruction when supine. No other
valvular pathology observed.

6.  Normal pericardium.  No pericardial effusion.

7. Grossly, no extracardiac findings. Recommended dedicated study if
concerned for non-cardiac pathology.
IMPRESSION: 1. Study suggestive of hypertrophic cardiomyopathy (septal variant).

2. Mild increase in left ventricular thickness by myocardial mass
index (99 g/m2), with intraventricular septal thickness of 20 mm.

3. Low normal left ventricular systolic function (LVEF =54%).

4. Left ventricular parametric mapping notable for increase in both
T2 and ECV signal in the basal anterior and inferior septum: 55 ms
and 40% at greatest (respectively).

5. There is no late gadolinium enhancement in the left ventricular
myocardium.

6. Presence of systolic anterior motion of the mitral valve with
associated mild mitral regurgitation. No velocity encoding aliasing
observed suggesting relatively low obstruction when supine.

## 2023-07-22 ENCOUNTER — Ambulatory Visit: Payer: No Typology Code available for payment source | Admitting: Internal Medicine

## 2023-07-22 VITALS — BP 132/82 | HR 88 | Temp 98.4°F | Ht 71.0 in | Wt 220.0 lb

## 2023-07-22 DIAGNOSIS — E538 Deficiency of other specified B group vitamins: Secondary | ICD-10-CM | POA: Diagnosis not present

## 2023-07-22 DIAGNOSIS — Z7984 Long term (current) use of oral hypoglycemic drugs: Secondary | ICD-10-CM

## 2023-07-22 DIAGNOSIS — I10 Essential (primary) hypertension: Secondary | ICD-10-CM

## 2023-07-22 DIAGNOSIS — K2100 Gastro-esophageal reflux disease with esophagitis, without bleeding: Secondary | ICD-10-CM

## 2023-07-22 DIAGNOSIS — R1310 Dysphagia, unspecified: Secondary | ICD-10-CM

## 2023-07-22 DIAGNOSIS — R011 Cardiac murmur, unspecified: Secondary | ICD-10-CM

## 2023-07-22 DIAGNOSIS — Z125 Encounter for screening for malignant neoplasm of prostate: Secondary | ICD-10-CM | POA: Diagnosis not present

## 2023-07-22 DIAGNOSIS — Z0001 Encounter for general adult medical examination with abnormal findings: Secondary | ICD-10-CM

## 2023-07-22 DIAGNOSIS — R1319 Other dysphagia: Secondary | ICD-10-CM

## 2023-07-22 DIAGNOSIS — E559 Vitamin D deficiency, unspecified: Secondary | ICD-10-CM | POA: Diagnosis not present

## 2023-07-22 DIAGNOSIS — N5201 Erectile dysfunction due to arterial insufficiency: Secondary | ICD-10-CM

## 2023-07-22 DIAGNOSIS — E785 Hyperlipidemia, unspecified: Secondary | ICD-10-CM

## 2023-07-22 DIAGNOSIS — E118 Type 2 diabetes mellitus with unspecified complications: Secondary | ICD-10-CM

## 2023-07-22 LAB — CBC WITH DIFFERENTIAL/PLATELET
Basophils Absolute: 0 10*3/uL (ref 0.0–0.1)
Basophils Relative: 0.3 % (ref 0.0–3.0)
Eosinophils Absolute: 0 10*3/uL (ref 0.0–0.7)
Eosinophils Relative: 0.3 % (ref 0.0–5.0)
HCT: 43.7 % (ref 39.0–52.0)
Hemoglobin: 14.2 g/dL (ref 13.0–17.0)
Lymphocytes Relative: 36 % (ref 12.0–46.0)
Lymphs Abs: 2.5 10*3/uL (ref 0.7–4.0)
MCHC: 32.5 g/dL (ref 30.0–36.0)
MCV: 86.2 fL (ref 78.0–100.0)
Monocytes Absolute: 0.5 10*3/uL (ref 0.1–1.0)
Monocytes Relative: 6.6 % (ref 3.0–12.0)
Neutro Abs: 3.9 10*3/uL (ref 1.4–7.7)
Neutrophils Relative %: 56.8 % (ref 43.0–77.0)
Platelets: 365 10*3/uL (ref 150.0–400.0)
RBC: 5.07 Mil/uL (ref 4.22–5.81)
RDW: 13.2 % (ref 11.5–15.5)
WBC: 6.9 10*3/uL (ref 4.0–10.5)

## 2023-07-22 LAB — VITAMIN B12: Vitamin B-12: 311 pg/mL (ref 211–911)

## 2023-07-22 LAB — URINALYSIS, ROUTINE W REFLEX MICROSCOPIC
Hgb urine dipstick: NEGATIVE
Leukocytes,Ua: NEGATIVE
Nitrite: NEGATIVE
RBC / HPF: NONE SEEN (ref 0–?)
Specific Gravity, Urine: >=1.03 — AB (ref 1.000–1.030)
Total Protein, Urine: 100 — AB
Urine Glucose: NEGATIVE
Urobilinogen, UA: 1 (ref 0.0–1.0)
pH: 6 (ref 5.0–8.0)

## 2023-07-22 LAB — MICROALBUMIN / CREATININE URINE RATIO
Creatinine,U: 432.3 mg/dL
Microalb Creat Ratio: 9.8 mg/g (ref 0.0–30.0)
Microalb, Ur: 42.3 mg/dL — ABNORMAL HIGH (ref 0.0–1.9)

## 2023-07-22 LAB — BASIC METABOLIC PANEL
BUN: 12 mg/dL (ref 6–23)
CO2: 30 meq/L (ref 19–32)
Calcium: 9.6 mg/dL (ref 8.4–10.5)
Chloride: 101 meq/L (ref 96–112)
Creatinine, Ser: 0.87 mg/dL (ref 0.40–1.50)
GFR: 100.7 mL/min (ref >60.00)
Glucose, Bld: 200 mg/dL — ABNORMAL HIGH (ref 70–99)
Potassium: 4.3 meq/L (ref 3.5–5.1)
Sodium: 138 meq/L (ref 135–145)

## 2023-07-22 LAB — HEPATIC FUNCTION PANEL
ALT: 9 U/L (ref 0–53)
AST: 13 U/L (ref 0–37)
Albumin: 4.1 g/dL (ref 3.5–5.2)
Alkaline Phosphatase: 48 U/L (ref 39–117)
Bilirubin, Direct: 0.1 mg/dL (ref 0.0–0.3)
Total Bilirubin: 0.7 mg/dL (ref 0.2–1.2)
Total Protein: 7.2 g/dL (ref 6.0–8.3)

## 2023-07-22 LAB — LIPID PANEL
Cholesterol: 218 mg/dL — ABNORMAL HIGH (ref 0–200)
HDL: 47.3 mg/dL (ref >39.00)
LDL Cholesterol: 149 mg/dL — ABNORMAL HIGH (ref 0–99)
NonHDL: 171.06
Total CHOL/HDL Ratio: 5
Triglycerides: 112 mg/dL (ref 0.0–149.0)
VLDL: 22.4 mg/dL (ref 0.0–40.0)

## 2023-07-22 LAB — TSH: TSH: 1.06 u[IU]/mL (ref 0.35–5.50)

## 2023-07-22 LAB — PSA: PSA: 0.51 ng/mL (ref 0.10–4.00)

## 2023-07-22 LAB — HEMOGLOBIN A1C: Hgb A1c MFr Bld: 9.5 % — ABNORMAL HIGH (ref 4.6–6.5)

## 2023-07-22 LAB — VITAMIN D 25 HYDROXY (VIT D DEFICIENCY, FRACTURES): VITD: 10.02 ng/mL — ABNORMAL LOW (ref 30.00–100.00)

## 2023-07-22 MED ORDER — METOPROLOL SUCCINATE ER 50 MG PO TB24: 50.0000 mg | ORAL_TABLET | Freq: Every day | ORAL | 3 refills | Status: AC

## 2023-07-22 MED ORDER — SILDENAFIL CITRATE 100 MG PO TABS: 50.0000 mg | ORAL_TABLET | Freq: Every day | ORAL | 11 refills | Status: DC | PRN

## 2023-07-22 NOTE — Progress Notes (Unsigned)
Patient ID: Howard Calhoun, male   DOB: 03-08-1971, 52 y.o.   MRN: [id number]         Chief Complaint:: wellness exam and Establish Care  As new pt lost to f/u > 3 yrs without any med tx; htn, dm, possible HCCM, worsening dysphagia, ED low vit d       HPI:  Howard Calhoun is a 52 y.o. male here for wellness exam; declines covid booster, for shingrix at pharmacy, declines optho or colonoscopy for now                      Also c/o worsening dysphagia to solids that was mild intermittent years ago but now daily mild to mod and cannot ignore, asking for GI eval that he put off before.  Also wanting to restart care for DM as has been non compliant with meds for 3 yrs, and only after father recently became a double amputee with DM he is now wanting to resume care.  Pt denies chest pain, increased sob or doe, wheezing, orthopnea, PND, increased LE swelling, palpitations, or syncope. Does have intermittent dizziness, and polydipsia and polyuria.  Had prior Echo with LVH but possible HCCM per Dr Allyson Sabal.  Also with worsening ED in past year, asking for tx.      Wt Readings from Last 3 Encounters:  07/22/23 220 lb (99.8 kg)  07/18/20 228 lb (103.4 kg)  07/13/20 232 lb (105.2 kg)   BP Readings from Last 3 Encounters:  07/22/23 132/82  07/18/20 108/74  06/22/20 (!) 140/100   Immunization History  Administered Date(s) Administered   Influenza,inj,Quad PF,6+ Mos 07/08/2018, 06/22/2020   Pneumococcal Conjugate-13 06/22/2020   Pneumococcal Polysaccharide-23 12/21/2015   Tdap 12/21/2015   Health Maintenance Due  Topic Date Due   OPHTHALMOLOGY EXAM  Never done   Colonoscopy  Never done   Zoster Vaccines- Shingrix (1 of 2) Never done   COVID-19 Vaccine (1 - 2023-24 season) Never done      Past Medical History:  Diagnosis Date   Diabetes mellitus without complication (HCC)    Hyperlipidemia    Hypertension    Past Surgical History:  Procedure Laterality Date   APPENDECTOMY  1992    ARTHROSCOPIC REPAIR ACL  1990   TONSILECTOMY/ADENOIDECTOMY WITH MYRINGOTOMY      reports that he has never smoked. He has never used smokeless tobacco. He reports that he does not drink alcohol and does not use drugs. family history includes Colon cancer in his paternal grandmother; Diabetes in his father; Heart disease in his father; Hodgkin's lymphoma in his father; Hypertension in his mother; Kidney disease in his father. Allergies  Allergen Reactions   Metformin And Related Diarrhea   Current Outpatient Medications on File Prior to Visit  Medication Sig Dispense Refill   olmesartan-hydrochlorothiazide (BENICAR HCT) 40-25 MG tablet Take 1 tablet by mouth daily. (Patient not taking: Reported on 07/22/2023) 90 tablet 1   No current facility-administered medications on file prior to visit.        ROS:  All others reviewed and negative.  Objective        PE:  BP 132/82 (BP Location: Right Arm, Patient Position: Sitting, Cuff Size: Normal)   Pulse 88   Temp 98.4 F (36.9 C) (Oral)   Ht 5\' 11"  (1.803 m)   Wt 220 lb (99.8 kg)   SpO2 99%   BMI 30.68 kg/m  Constitutional: Pt appears in NAD               HENT: Head: NCAT.                Right Ear: External ear normal.                 Left Ear: External ear normal.                Eyes: . Pupils are equal, round, and reactive to light. Conjunctivae and EOM are normal               Nose: without d/c or deformity               Neck: Neck supple. Gross normal ROM               Cardiovascular: Normal rate and regular rhythm.                 Pulmonary/Chest: Effort normal and breath sounds without rales or wheezing.                Abd:  Soft, NT, ND, + BS, no organomegaly               Neurological: Pt is alert. At baseline orientation, motor grossly intact               Skin: Skin is warm. No rashes, no other new lesions, LE edema - none               Psychiatric: Pt behavior is normal without agitation   Micro:  none  Cardiac tracings I have personally interpreted today:  none  Pertinent Radiological findings (summarize): none   Lab Results  Component Value Date   WBC 6.9 07/22/2023   HGB 14.2 07/22/2023   HCT 43.7 07/22/2023   PLT 365.0 07/22/2023   GLUCOSE 200 (H) 07/22/2023   CHOL 218 (H) 07/22/2023   TRIG 112.0 07/22/2023   HDL 47.30 07/22/2023   LDLCALC 149 (H) 07/22/2023   ALT 9 07/22/2023   AST 13 07/22/2023   NA 138 07/22/2023   K 4.3 07/22/2023   CL 101 07/22/2023   CREATININE 0.87 07/22/2023   BUN 12 07/22/2023   CO2 30 07/22/2023   TSH 1.06 07/22/2023   PSA 0.51 07/22/2023   HGBA1C 9.5 Repeated and verified X2. (H) 07/22/2023   MICROALBUR 42.3 (H) 07/22/2023   Assessment/Plan:  Howard Calhoun is a 52 y.o. Black or African American [2] male with  has a past medical history of Diabetes mellitus without complication (HCC), Hyperlipidemia, and Hypertension.  Encounter for well adult exam with abnormal findings Age and sex appropriate education and counseling updated with regular exercise and diet Referrals for preventative services - declines optho or colnoscopy for now given so much else to do now, will do next time Immunizations addressed - declines covid booster, for shingrix at pharmacy Smoking counseling  - none needed Evidence for depression or other mood disorder - none significant Most recent labs reviewed. I have personally reviewed and have noted: 1) the patient's medical and social history 2) The patient's current medications and supplements 3) The patient's height, weight, and BMI have been recorded in the chart   Vitamin D deficiency disease Last vitamin D Lab Results  Component Value Date   VD25OH 10.02 (L) 07/22/2023   Low, to start oral replacement   Type 2 diabetes mellitus with complication, without long-term current use of  insulin (HCC) Lab Results  Component Value Date   HGBA1C 9.5 Repeated and verified X2. (H) 07/22/2023    uncontrolled, pt to restart tx with actos 30 mg every day, glipizide ER 10 every day, and possible add GLP1 next visit    Murmur, cardiac With possible HCCM vs HTN heart dz - for restart toprol xl 50 every day, repeat Echo and refer cardiology Dr Allyson Sabal  Hyperlipidemia with target LDL less than 100 Lab Results  Component Value Date   LDLCALC 149 (H) 07/22/2023   Stable, pt to restart lipitr 40 mg qd   Esophageal dysphagia With worsening symptoms, also due for colonoscopy - for GI referral likely needs EGD, also for barium swallow as may take some time for GI eval  Essential hypertension BP Readings from Last 3 Encounters:  07/22/23 132/82  07/18/20 108/74  06/22/20 (!) 140/100   Uncontrolled,, pt to restart toprl xl 50 qd   Erectile dysfunction due to arterial insufficiency With symptomatic worsening - for viagra prn  Gastroesophageal reflux disease with esophagitis For restart prilosec 40 qd  Followup: Return in about 3 months (around 10/22/2023).  Oliver Barre, MD 07/23/2023 6:16 AM [facility]

## 2023-07-22 NOTE — Patient Instructions (Signed)
Please take all new medication as prescribed - the toprol xl 50 mg per day, and Viagra as needed  Please continue all other medications as before, and refills have been done if requested.  Please have the pharmacy call with any other refills you may need.  Please continue your efforts at being more active, low cholesterol diet, and weight control.  You are otherwise up to date with prevention measures today.  Please keep your appointments with your specialists as you may have planned  You will be contacted regarding the referral for: Echo, cardiology, Barium swallow and Gastroenterology  Please go to the LAB at the blood drawing area for the tests to be done  You will be contacted by phone if any changes need to be made immediately.  Otherwise, you will receive a letter about your results with an explanation, but please check with MyChart first.  Please make an Appointment to return in 3 months, or sooner if needed

## 2023-07-23 ENCOUNTER — Encounter: Payer: Self-pay | Admitting: Internal Medicine

## 2023-07-23 MED ORDER — GLIPIZIDE ER 10 MG PO TB24: 10.0000 mg | ORAL_TABLET | Freq: Every day | ORAL | 3 refills | Status: DC

## 2023-07-23 MED ORDER — PIOGLITAZONE HCL 30 MG PO TABS: 30.0000 mg | ORAL_TABLET | Freq: Every day | ORAL | 3 refills | Status: AC

## 2023-07-23 MED ORDER — OMEPRAZOLE 40 MG PO CPDR
40.0000 mg | DELAYED_RELEASE_CAPSULE | Freq: Every day | ORAL | 3 refills | Status: AC
Start: 2023-07-23 — End: ?

## 2023-07-23 MED ORDER — ATORVASTATIN CALCIUM 40 MG PO TABS: 40.0000 mg | ORAL_TABLET | Freq: Every day | ORAL | 3 refills | Status: AC

## 2023-07-23 NOTE — Assessment & Plan Note (Signed)
Lab Results  Component Value Date   LDLCALC 149 (H) 07/22/2023   Stable, pt to restart lipitr 40 mg qd

## 2023-07-23 NOTE — Assessment & Plan Note (Signed)
With symptomatic worsening - for viagra prn

## 2023-07-23 NOTE — Assessment & Plan Note (Signed)
Lab Results  Component Value Date   HGBA1C 9.5 Repeated and verified X2. (H) 07/22/2023   uncontrolled, pt to restart tx with actos 30 mg every day, glipizide ER 10 every day, and possible add GLP1 next visit

## 2023-07-23 NOTE — Assessment & Plan Note (Signed)
For restart prilosec 40 qd

## 2023-07-23 NOTE — Assessment & Plan Note (Signed)
Age and sex appropriate education and counseling updated with regular exercise and diet Referrals for preventative services - declines optho or colnoscopy for now given so much else to do now, will do next time Immunizations addressed - declines covid booster, for shingrix at pharmacy Smoking counseling  - none needed Evidence for depression or other mood disorder - none significant Most recent labs reviewed. I have personally reviewed and have noted: 1) the patient's medical and social history 2) The patient's current medications and supplements 3) The patient's height, weight, and BMI have been recorded in the chart

## 2023-07-23 NOTE — Assessment & Plan Note (Signed)
BP Readings from Last 3 Encounters:  07/22/23 132/82  07/18/20 108/74  06/22/20 (!) 140/100   Uncontrolled,, pt to restart toprl xl 50 qd

## 2023-07-23 NOTE — Assessment & Plan Note (Addendum)
With worsening symptoms, also due for colonoscopy - for GI referral likely needs EGD, also for barium swallow as may take some time for GI eval

## 2023-07-23 NOTE — Assessment & Plan Note (Signed)
Last vitamin D Lab Results  Component Value Date   VD25OH 10.02 (L) 07/22/2023   Low, to start oral replacement

## 2023-07-23 NOTE — Assessment & Plan Note (Addendum)
With possible HCCM vs HTN heart dz - for restart toprol xl 50 every day, repeat Echo and refer cardiology Dr Allyson Sabal

## 2023-08-01 ENCOUNTER — Ambulatory Visit
Admission: RE | Admit: 2023-08-01 | Discharge: 2023-08-01 | Disposition: A | Discharge status: Home / Self Care | Payer: No Typology Code available for payment source | Source: Ambulatory Visit | Attending: Internal Medicine | Admitting: Internal Medicine

## 2023-08-01 ENCOUNTER — Telehealth: Payer: Self-pay | Admitting: Internal Medicine

## 2023-08-01 DIAGNOSIS — R1310 Dysphagia, unspecified: Secondary | ICD-10-CM

## 2023-08-01 NOTE — Telephone Encounter (Signed)
Patient received the results of his barium swallow and said he was unable to respond via MyChart. He said he was having trouble swallowing pills right now and would like advise about what he should do. Patient would like a call back at [phone].

## 2023-08-01 NOTE — Telephone Encounter (Signed)
There is no specific treatment for now, but he should continue the with the referral to GI for further evaluation.  thanks

## 2023-08-05 ENCOUNTER — Telehealth: Payer: Self-pay | Admitting: Internal Medicine

## 2023-08-05 ENCOUNTER — Encounter: Payer: Self-pay | Admitting: Gastroenterology

## 2023-08-05 NOTE — Telephone Encounter (Signed)
Very sorry, I can't do anything else regarding the referral process; pt would need to go to ED for any fever, weakness, falls, pain or other unusual symptoms  thanks

## 2023-08-05 NOTE — Telephone Encounter (Signed)
Pt called wanting to know what can he do since his appt for an GI Doctor isn't until February.

## 2023-08-07 NOTE — Telephone Encounter (Signed)
Called and let PT know.

## 2023-08-12 ENCOUNTER — Ambulatory Visit (HOSPITAL_COMMUNITY): Payer: No Typology Code available for payment source | Attending: Internal Medicine

## 2023-08-12 DIAGNOSIS — R011 Cardiac murmur, unspecified: Secondary | ICD-10-CM | POA: Insufficient documentation

## 2023-08-12 LAB — ECHOCARDIOGRAM COMPLETE
Calc EF: 52 %
S' Lateral: 2.56 cm
Single Plane A2C EF: 53.6 %
Single Plane A4C EF: 49.9 %

## 2023-08-25 ENCOUNTER — Encounter: Payer: Self-pay | Admitting: Internal Medicine

## 2023-08-25 NOTE — Telephone Encounter (Signed)
 Care team updated and letter sent for eye exam notes.

## 2023-09-09 ENCOUNTER — Telehealth: Payer: Self-pay | Admitting: Pharmacist Clinician (PhC)/ Clinical Pharmacy Specialist

## 2023-09-09 ENCOUNTER — Encounter: Payer: Self-pay | Admitting: Cardiovascular Disease

## 2023-09-09 ENCOUNTER — Ambulatory Visit
Payer: No Typology Code available for payment source | Attending: Cardiovascular Disease | Admitting: Cardiovascular Disease

## 2023-09-09 VITALS — BP 152/94 | HR 93 | Ht 71.0 in | Wt 226.4 lb

## 2023-09-09 DIAGNOSIS — Z8249 Family history of ischemic heart disease and other diseases of the circulatory system: Secondary | ICD-10-CM | POA: Diagnosis not present

## 2023-09-09 DIAGNOSIS — I421 Obstructive hypertrophic cardiomyopathy: Secondary | ICD-10-CM | POA: Insufficient documentation

## 2023-09-09 DIAGNOSIS — I10 Essential (primary) hypertension: Secondary | ICD-10-CM

## 2023-09-09 DIAGNOSIS — E785 Hyperlipidemia, unspecified: Secondary | ICD-10-CM

## 2023-09-09 NOTE — Assessment & Plan Note (Signed)
History of hyperlipidemia on atorvastatin which unfortunately he has been unable to take because of the dysphagia.  His last lipid profile performed 07/22/2023 revealed total cholesterol 218, LDL 149 and HDL 47.  He may be a candidate for a PCSK9.

## 2023-09-09 NOTE — Assessment & Plan Note (Signed)
Patient was originally sent to me 3 years ago because of a murmur.  2D echo was consistent with hypertrophic obstructive cardiomyopathy.  Did have a negative stress echo.  He had a cardiac MRI performed 11/03/2020 that showed septal variant of hypertrophic cardiomyopathy with low normal ejection fraction and systolic anterior motion of the mitral valve.  He is completely asymptomatic.  There is no family history of sudden cardiac death.  I am referring him to Dr.Chandrasekhar for further evaluation.

## 2023-09-09 NOTE — Assessment & Plan Note (Signed)
History of essential hypertension with blood pressure measured today at 152/94.  He is on Benicar/hydrochlorothiazide and metoprolol although he has not been able to take his medications because of the swallowing disorder.

## 2023-09-09 NOTE — Progress Notes (Signed)
09/09/2023 [name]   1971-04-20  [id number]  Primary Physician Jonny Ruiz Len Blalock, MD Primary Cardiologist: Runell Gess MD Nicholes Calamity, [place]  HPI:  Howard Calhoun is a 52 y.o.  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 Oliver Barre for cardiovascular valuation because of an auscultated murmur.  I last saw him in the office 07/18/2020. His risk factors include treated diabetes, hypertension and hyperlipidemia.  Both his mother and father have ischemic heart disease and have had stents.  He is never had a heart attack or stroke.  He does get somewhat winded when playing with his 3 children.  His primary care physician auscultated a murmur and obtain a 2D echo cardiogram 6-19/20 revealing hyperdynamic left ventricle with asymmetric septal thickening and HOCM physiology with a gradient of 3.8 m/s.   He had a Myoview stress test performed 07/05/2020 showed an EF of about 50% with inferior thinning which looked like diaphragmatic attenuation although it was read as prior infarct.  Since I saw him 3 years ago he did have a stress echo performed 12/05/2020 that was low risk and nonischemic.  He had a cardiac MRI performed 11/03/2020 that was consistent with hypertrophic cardiomyopathy with an EF of 54% and systolic anterior motion of the mitral valve.  His most recent 2D echo performed 08/12/2023 revealed normal LV systolic function with severe asymmetric septal hypertrophy and outflow tract gradient of 18 mmHg with Valsalva.  He is completely asymptomatic.   Current Meds  Medication Sig   atorvastatin (LIPITOR) 40 MG tablet Take 1 tablet (40 mg total) by mouth daily.   glipiZIDE (GLUCOTROL XL) 10 MG 24 hr tablet Take 1 tablet (10 mg total) by mouth daily with breakfast.   metoprolol succinate (TOPROL-XL) 50 MG 24 hr tablet Take 1 tablet (50 mg total) by mouth daily. Take with  or immediately following a meal.   olmesartan-hydrochlorothiazide (BENICAR HCT) 40-25 MG tablet Take 1 tablet by mouth daily.   omeprazole (PRILOSEC) 40 MG capsule Take 1 capsule (40 mg total) by mouth daily.   pioglitazone (ACTOS) 30 MG tablet Take 1 tablet (30 mg total) by mouth daily.   sildenafil (VIAGRA) 100 MG tablet Take 0.5-1 tablets (50-100 mg total) by mouth daily as needed for erectile dysfunction.     Allergies  Allergen Reactions   Metformin And Related Diarrhea    Social History   Socioeconomic History   Marital status: Married    Spouse name: Not on file   Number of children: 3   Years of education: Not on file   Highest education level: Some college, no degree  Occupational History   Occupation: Education officer, environmental  Tobacco Use   Smoking status: Never   Smokeless tobacco: Never  Vaping Use   Vaping status: Never Used  Substance and Sexual Activity   Alcohol use: No    Comment: 1-2 a month   Drug use: No   Sexual activity: Yes    Birth control/protection: None  Other Topics Concern   Not on file  Social History Narrative   Not on file   Social Drivers of Health   Financial Resource Strain: Low Risk  (07/22/2023)   Overall Financial Resource Strain (CARDIA)    Difficulty of Paying Living Expenses: Not hard at all  Food Insecurity: No Food Insecurity (07/22/2023)   Hunger Vital Sign  Worried About Programme researcher, broadcasting/film/video in the Last Year: Never true    Ran Out of Food in the Last Year: Never true  Transportation Needs: No Transportation Needs (07/22/2023)   PRAPARE - Administrator, Civil Service (Medical): No    Lack of Transportation (Non-Medical): No  Physical Activity: Unknown (07/22/2023)   Exercise Vital Sign    Days of Exercise per Week: 0 days    Minutes of Exercise per Session: Not on file  Stress: No Stress Concern Present (07/22/2023)   [employer] of Occupational Health - Occupational Stress Questionnaire    Feeling of Stress :  Not at all  Social Connections: Unknown (07/22/2023)   Social Connection and Isolation Panel [NHANES]    Frequency of Communication with Friends and Family: Once a week    Frequency of Social Gatherings with Friends and Family: Patient declined    Attends Religious Services: More than 4 times per year    Active Member of [employer] or Organizations: No    Attends Engineer, structural: Not on file    Marital Status: Married  Catering manager Violence: Not on file     Review of Systems: General: negative for chills, fever, night sweats or weight changes.  Cardiovascular: negative for chest pain, dyspnea on exertion, edema, orthopnea, palpitations, paroxysmal nocturnal dyspnea or shortness of breath Dermatological: negative for rash Respiratory: negative for cough or wheezing Urologic: negative for hematuria Abdominal: negative for nausea, vomiting, diarrhea, bright red blood per rectum, melena, or hematemesis Neurologic: negative for visual changes, syncope, or dizziness All other systems reviewed and are otherwise negative except as noted above.    Blood pressure (!) 152/94, pulse 93, height 5\' 11"  (1.803 m), weight 226 lb 6.4 oz (102.7 kg), SpO2 96%.  General appearance: alert and no distress Neck: no adenopathy, no carotid bruit, no JVD, supple, symmetrical, trachea midline, and thyroid not enlarged, symmetric, no tenderness/mass/nodules Lungs: clear to auscultation bilaterally Heart: 2/6 outflow tract murmur which augmented with Valsalva consistent with hypertrophic cardiomyopathy. Extremities: extremities normal, atraumatic, no cyanosis or edema Pulses: 2+ and symmetric Skin: Skin color, texture, turgor normal. No rashes or lesions Neurologic: Grossly normal  EKG EKG Interpretation Date/Time:  Tuesday September 09 2023 10:19:46 EST Ventricular Rate:  93 PR Interval:  194 QRS Duration:  106 QT Interval:  356 QTC Calculation: 442 R Axis:   87  Text  Interpretation: Normal sinus rhythm Minimal voltage criteria for LVH, may be normal variant ( Cornell product ) ST & T wave abnormality, consider inferior ischemia No previous ECGs available Confirmed by Nanetta Batty [phone]) on 09/09/2023 10:22:28 AM    ASSESSMENT AND PLAN:   Essential hypertension History of essential hypertension with blood pressure measured today at 152/94.  He is on Benicar/hydrochlorothiazide and metoprolol although he has not been able to take his medications because of the swallowing disorder.  Hyperlipidemia with target LDL less than 100 History of hyperlipidemia on atorvastatin which unfortunately he has been unable to take because of the dysphagia.  His last lipid profile performed 07/22/2023 revealed total cholesterol 218, LDL 149 and HDL 47.  He may be a candidate for a PCSK9.  Obstructive hypertrophic cardiomyopathy (HCC) Patient was originally sent to me 3 years ago because of a murmur.  2D echo was consistent with hypertrophic obstructive cardiomyopathy.  Did have a negative stress echo.  He had a cardiac MRI performed 11/03/2020 that showed septal variant of hypertrophic cardiomyopathy with low normal ejection fraction and systolic  anterior motion of the mitral valve.  He is completely asymptomatic.  There is no family history of sudden cardiac death.  I am referring him to Dr.Chandrasekhar for further evaluation.  Family history of heart disease Both parents had stents.  Mother is deceased.     Runell Gess MD FACP,FACC,FAHA, [facility] 09/09/2023 10:38 AM

## 2023-09-09 NOTE — Patient Instructions (Addendum)
Medication Instructions:  Your physician recommends that you continue on your current medications as directed. Please refer to the Current Medication list given to you today.  *If you need a refill on your cardiac medications before your next appointment, please call your pharmacy*   Testing/Procedures: Dr. Allyson Sabal has ordered a CT coronary calcium score.   Test locations:  [facility]  [facility]  This is $99 out of pocket.   Coronary CalciumScan A coronary calcium scan is an imaging test used to look for deposits of calcium and other fatty materials (plaques) in the inner lining of the blood vessels of the heart (coronary arteries). These deposits of calcium and plaques can partly clog and narrow the coronary arteries without producing any symptoms or warning signs. This puts a person at risk for a heart attack. This test can detect these deposits before symptoms develop. Tell a health care provider about: Any allergies you have. All medicines you are taking, including vitamins, herbs, eye drops, creams, and over-the-counter medicines. Any problems you or family members have had with anesthetic medicines. Any blood disorders you have. Any surgeries you have had. Any medical conditions you have. Whether you are pregnant or may be pregnant. What are the risks? Generally, this is a safe procedure. However, problems may occur, including: Harm to a pregnant woman and her unborn baby. This test involves the use of radiation. Radiation exposure can be dangerous to a pregnant woman and her unborn baby. If you are pregnant, you generally should not have this procedure done. Slight increase in the risk of cancer. This is because of the radiation involved in the test. What happens before the procedure? No preparation is needed for this procedure. What happens during the procedure? You will undress and  remove any jewelry around your neck or chest. You will put on a hospital gown. Sticky electrodes will be placed on your chest. The electrodes will be connected to an electrocardiogram (ECG) machine to record a tracing of the electrical activity of your heart. A CT scanner will take pictures of your heart. During this time, you will be asked to lie still and hold your breath for 2-3 seconds while a picture of your heart is being taken. The procedure may vary among health care providers and hospitals. What happens after the procedure? You can get dressed. You can return to your normal activities. It is up to you to get the results of your test. Ask your health care provider, or the department that is doing the test, when your results will be ready. Summary A coronary calcium scan is an imaging test used to look for deposits of calcium and other fatty materials (plaques) in the inner lining of the blood vessels of the heart (coronary arteries). Generally, this is a safe procedure. Tell your health care provider if you are pregnant or may be pregnant. No preparation is needed for this procedure. A CT scanner will take pictures of your heart. You can return to your normal activities after the scan is done. This information is not intended to replace advice given to you by your health care provider. Make sure you discuss any questions you have with your health care provider. Document Released: 02/29/2008 Document Revised: 07/22/2016 Document Reviewed: 07/22/2016 Elsevier Interactive Patient Education  2017 [employer].    Follow-Up: At [facility], you and your health needs are our priority.  As part of our continuing  mission to provide you with exceptional heart care, we have created designated Provider Care Teams.  These Care Teams include your primary Cardiologist (physician) and Advanced Practice Providers (APPs -  Physician Assistants and Nurse Practitioners) who all work together to  provide you with the care you need, when you need it.  We recommend signing up for the patient portal called "MyChart".  Sign up information is provided on this After Visit Summary.  MyChart is used to connect with patients for Virtual Visits (Telemedicine).  Patients are able to view lab/test results, encounter notes, upcoming appointments, etc.  Non-urgent messages can be sent to your provider as well.   To learn more about what you can do with MyChart, go to [email or web address].    Your next appointment:   12 month(s)  Provider:   Nanetta Batty, MD   Other Instructions We will call to discuss medication alternatives.

## 2023-09-09 NOTE — Telephone Encounter (Signed)
Received message, patient having difficulty swallowing tablets, wanting to know about which meds can be crushed or come in liquid form.  Atorvastatin is available in liquid form, will send request to [facility] to price and see if eligible on patient plan.    Metoprolol succ - can switch to carvedilol (6.25 mg bid), much smaller tablet, easier to swallow  Olmesartan hctz - cannot be crushed, but could switch to valsartan hctz, which can be crushed, or valsartan solution (again, will price on his insurance) and take separate hctz tablet (which is small and can be crushed)  Omeprazole comes in liquid formulation (will price with insurance)  Glipizide can be crushed, pioglitazone cannot.  He should check with that MD regarding options.    Sildenafil - can switch to 20 mg tablets and increase # of tabs or just crush them

## 2023-09-09 NOTE — Assessment & Plan Note (Signed)
Both parents had stents.  Mother is deceased.

## 2023-09-12 ENCOUNTER — Other Ambulatory Visit (HOSPITAL_COMMUNITY): Payer: Self-pay

## 2023-09-12 NOTE — Telephone Encounter (Signed)
Spoke with pt regarding options for medications that can be crushed. Pt will look into purchasing a pill crusher. I will send these medications recommendations to Dr. Allyson Sabal to advise. Pt will start to take medications that he can crush.

## 2023-09-12 NOTE — Telephone Encounter (Signed)
Worked with Pharmacologist, the liquid forms that are available all require prior authorization and cash prices are between $500-$5,000 per month.     Recommend he purchase a tablet crusher ($10 or less at most pharmacies or on [employer]).  Can mix the ground up tablets with applesauce or yogurt.    Atorvastatin - crush  Metoprolol - switch to metoprolol tartrate 25 mg bid and crush  Olmesartan hctz - switch to valsartan hctz 320/25 and crush  Glipizide - crush  Pioglitazone - cannot be crushed - defer to PCP or Endocrinologist  Sildenafil - crush  Omeprazole - cannot crush the OTC tablet.  Prescription comes in capsule form and can be opened and sprinkled (small pellets) onto applesauce

## 2023-10-22 ENCOUNTER — Ambulatory Visit (INDEPENDENT_AMBULATORY_CARE_PROVIDER_SITE_OTHER): Payer: No Typology Code available for payment source | Admitting: Internal Medicine

## 2023-10-22 ENCOUNTER — Encounter: Payer: Self-pay | Admitting: Internal Medicine

## 2023-10-22 VITALS — BP 142/86 | HR 74 | Temp 98.1°F | Ht 71.0 in | Wt 223.5 lb

## 2023-10-22 DIAGNOSIS — E139 Other specified diabetes mellitus without complications: Secondary | ICD-10-CM

## 2023-10-22 DIAGNOSIS — N5201 Erectile dysfunction due to arterial insufficiency: Secondary | ICD-10-CM

## 2023-10-22 DIAGNOSIS — I10 Essential (primary) hypertension: Secondary | ICD-10-CM | POA: Diagnosis not present

## 2023-10-22 MED ORDER — TADALAFIL 20 MG PO TABS: 20.0000 mg | ORAL_TABLET | ORAL | 2 refills | Status: AC | PRN

## 2023-10-22 MED ORDER — FREESTYLE LIBRE 3 READER DEVI: 1.0000 | Freq: Every day | 3 refills | Status: AC

## 2023-10-22 MED ORDER — NOVOFINE PEN NEEDLE 32G X 6 MM MISC: 1.0000 | Freq: Every day | 0 refills | Status: DC

## 2023-10-22 MED ORDER — SOLIQUA 100-33 UNT-MCG/ML ~~LOC~~ SOPN: 60.0000 [IU] | PEN_INJECTOR | Freq: Every day | SUBCUTANEOUS | 0 refills | Status: AC

## 2023-10-22 MED ORDER — AMLODIPINE BESYLATE 5 MG PO TABS: 5.0000 mg | ORAL_TABLET | Freq: Every day | ORAL | 0 refills | Status: AC

## 2023-10-22 MED ORDER — FREESTYLE LIBRE 3 SENSOR MISC: 1.0000 | Freq: Every day | 5 refills | Status: AC

## 2023-10-22 NOTE — Progress Notes (Signed)
 Subjective:  Patient ID: Howard Calhoun, male    DOB: 1971/01/01  Age: 53 y.o. MRN: [id number]  CC: Hypertension, Hyperlipidemia, and Diabetes   HPI CAMP GOPAL presents for f/up ---  Discussed the use of AI scribe software for clinical note transcription with the patient, who gave verbal consent to proceed.  History of Present Illness   Howard Calhoun is a 53 year old male who presents with difficulty swallowing and medication management issues.  He has experienced difficulty swallowing for the past four to five years, with progressive worsening. He describes a sensation of food getting stuck and needing to drink liquids to help food pass down. Sometimes, the liquid itself does not go down, causing a gurgling sound. He has difficulty swallowing pills, which often get stuck and come back up, particularly mentioning issues with glipizide  due to its coating. He has been chewing some medications to aid swallowing. A barium swallow test showed that pills get stuck at the lower esophagus. He is trying to find a gastroenterologist within his network. No heartburn or indigestion is reported, but eating certain foods like steak and chicken has become difficult. No chest pain, shortness of breath, headache, or blurred vision.  Regarding diabetes management, he has not used insulin  since his last visit and reports frequent urination but no excessive thirst outside of meals. He has not been monitoring his blood sugar levels recently.       Outpatient Medications Prior to Visit  Medication Sig Dispense Refill   atorvastatin  (LIPITOR) 40 MG tablet Take 1 tablet (40 mg total) by mouth daily. 90 tablet 3   metoprolol  succinate (TOPROL -XL) 50 MG 24 hr tablet Take 1 tablet (50 mg total) by mouth daily. Take with or immediately following a meal. 90 tablet 3   omeprazole  (PRILOSEC) 40 MG capsule Take 1 capsule (40 mg total) by mouth daily. 90 capsule 3   pioglitazone  (ACTOS ) 30 MG tablet Take 1  tablet (30 mg total) by mouth daily. 90 tablet 3   glipiZIDE  (GLUCOTROL  XL) 10 MG 24 hr tablet Take 1 tablet (10 mg total) by mouth daily with breakfast. 90 tablet 3   olmesartan -hydrochlorothiazide (BENICAR  HCT) 40-25 MG tablet Take 1 tablet by mouth daily. 90 tablet 1   sildenafil  (VIAGRA ) 100 MG tablet Take 0.5-1 tablets (50-100 mg total) by mouth daily as needed for erectile dysfunction. 5 tablet 11   No facility-administered medications prior to visit.    ROS Review of Systems  Constitutional:  Negative for appetite change, chills, diaphoresis, fatigue, fever and unexpected weight change.  HENT:  Positive for trouble swallowing. Negative for sinus pressure, sore throat and voice change.   Eyes: Negative.   Respiratory: Negative.  Negative for chest tightness, shortness of breath and wheezing.   Cardiovascular:  Negative for chest pain, palpitations and leg swelling.  Gastrointestinal: Negative.  Negative for abdominal pain, constipation, diarrhea, nausea and vomiting.  Genitourinary: Negative.  Negative for difficulty urinating.       ++ED  Musculoskeletal: Negative.  Negative for arthralgias and neck stiffness.  Neurological:  Negative for dizziness and weakness.  Hematological:  Negative for adenopathy. Does not bruise/bleed easily.  Psychiatric/Behavioral: Negative.      Objective:  BP (!) 142/86   Pulse 74   Temp 98.1 F (36.7 C) (Temporal)   Ht 5' 11 (1.803 m)   Wt 223 lb 8 oz (101.4 kg)   SpO2 99%   BMI 31.17 kg/m   BP Readings from Last 3  Encounters:  10/22/23 (!) 142/86  09/09/23 (!) 152/94  07/22/23 132/82    Wt Readings from Last 3 Encounters:  10/22/23 223 lb 8 oz (101.4 kg)  09/09/23 226 lb 6.4 oz (102.7 kg)  07/22/23 220 lb (99.8 kg)    Physical Exam Vitals reviewed.  Constitutional:      Appearance: Normal appearance.  HENT:     Nose: Nose normal.     Mouth/Throat:     Pharynx: Oropharynx is clear.  Eyes:     General: No scleral icterus.     Conjunctiva/sclera: Conjunctivae normal.  Cardiovascular:     Rate and Rhythm: Normal rate and regular rhythm.     Heart sounds: No murmur heard.    No friction rub. No gallop.  Pulmonary:     Effort: Pulmonary effort is normal.     Breath sounds: No stridor. No wheezing, rhonchi or rales.  Abdominal:     General: Abdomen is flat.     Palpations: There is no mass.     Tenderness: There is no abdominal tenderness. There is no guarding.     Hernia: No hernia is present.  Musculoskeletal:        General: Normal range of motion.     Cervical back: Neck supple.     Right lower leg: No edema.     Left lower leg: No edema.  Lymphadenopathy:     Cervical: No cervical adenopathy.  Skin:    General: Skin is warm and dry.     Coloration: Skin is not pale.  Neurological:     General: No focal deficit present.     Mental Status: He is alert. Mental status is at baseline.  Psychiatric:        Mood and Affect: Mood normal.        Behavior: Behavior normal.     Lab Results  Component Value Date   WBC 6.9 07/22/2023   HGB 14.2 07/22/2023   HCT 43.7 07/22/2023   PLT 365.0 07/22/2023   GLUCOSE 200 (H) 07/22/2023   CHOL 218 (H) 07/22/2023   TRIG 112.0 07/22/2023   HDL 47.30 07/22/2023   LDLCALC 149 (H) 07/22/2023   ALT 9 07/22/2023   AST 13 07/22/2023   NA 138 07/22/2023   K 4.3 07/22/2023   CL 101 07/22/2023   CREATININE 0.87 07/22/2023   BUN 12 07/22/2023   CO2 30 07/22/2023   TSH 1.06 07/22/2023   PSA 0.51 07/22/2023   HGBA1C 9.5 Repeated and verified X2. (H) 07/22/2023   MICROALBUR 42.3 (H) 07/22/2023    DG ESOPHAGUS W DOUBLE CM (HD) Result Date: 08/01/2023 CLINICAL DATA:  Persistent daily dysphagia. EXAM: ESOPHOGRAM / BARIUM SWALLOW / BARIUM TABLET STUDY TECHNIQUE: Combined double contrast and single contrast examination performed using effervescent crystals, thick barium liquid, and thin barium liquid. The patient was observed with fluoroscopy swallowing a 13 mm barium  sulphate tablet. FLUOROSCOPY: Radiation Exposure Index (as provided by the fluoroscopic device): 16.1 mGy reference air Kerma. Fluoroscopy time: 1 minute, 31 seconds. COMPARISON:  None Available. FINDINGS: Swallowing function: Normal.  Prominent cricopharyngeal impression. Esophagus: Normal appearance. Esophageal motility: Moderate hang up of contrast in the lower esophagus in the standing position. No definite secondary stripping wave was observed. No tertiary contractions. Gastroesophageal reflux: None was elicited with Valsalva or water siphon. Ingested 13mm barium tablet: Became stuck at the lower esophageal sphincter. Other:  No hiatal hernia. IMPRESSION: 1. Moderate esophageal dysmotility, with moderate of contrast in the lower esophagus in  the standing position. No definite secondary stripping wave was observed. 2. The ingested 13 mm barium tablet became stuck at the lower esophageal sphincter. 3. Prominent cricopharyngeal impression. Electronically Signed   By: Ryan Chess M.D.   On: 08/01/2023 15:18    Assessment & Plan:   Diabetes 1.5, managed as type 1 (HCC) - Will try to get better control of his blood sugar. -     FreeStyle Libre 3 Sensor; 1 Act by Does not apply route daily. Place 1 sensor on the skin every 14 days. Use to check glucose continuously  Dispense: 2 each; Refill: 5 -     FreeStyle Libre 3 Reader; 1 Act by Does not apply route daily.  Dispense: 1 each; Refill: 3 -     Soliqua ; Inject 60 Units into the skin daily.  Dispense: 54 mL; Refill: 0 -     AMB Referral VBCI Care Management -     Novofine Pen Needle; 1 Act by Does not apply route daily.  Dispense: 100 each; Refill: 0 -     Amb Referral to Nutrition and Diabetic Education  Essential hypertension- He has not achieved his BP goal. Will add a CCB. -     amLODIPine  Besylate; Take 1 tablet (5 mg total) by mouth daily.  Dispense: 90 tablet; Refill: 0 -     AMB Referral VBCI Care Management  Erectile dysfunction due to  arterial insufficiency -     Tadalafil ; Take 1 tablet (20 mg total) by mouth every other day as needed for erectile dysfunction.  Dispense: 10 tablet; Refill: 2     Follow-up: Return in about 3 months (around 01/19/2024).  Debby Molt, MD

## 2023-10-22 NOTE — Patient Instructions (Addendum)
 START Soliqua  at 20 units per day. Increase by 2 units every 3 days until you reach the dose of 60 units per day.   Type 1 Diabetes Mellitus, Diagnosis, Adult  Type 1 diabetes mellitus, or type 1 diabetes, is a long-term (chronic) disease. It happens when the cells in the pancreas that make a hormone called insulin  are destroyed. Normally, insulin  lets blood sugar (glucose) enter cells in your body. This gives you energy. If you have type 1 diabetes, glucose cannot get into cells. It builds up in the blood instead. This causes high blood glucose (hyperglycemia). There is no cure for type 1 diabetes. Treatment can help you manage your condition. What are the causes? The exact cause of type 1 diabetes is not known. What increases the risk? You may be more likely to have type 1 diabetes if a family member has it as well. You may also be more at risk if: You have a gene for type 1 diabetes that was passed down to you from a parent (inherited). You have an autoimmune disorder. This means that your body's disease-fighting system (immune system) attacks your body. You have been exposed to certain viruses. You live in an area with cold weather. What are the signs or symptoms? Symptoms may begin slowly over days or weeks. They may also start all of a sudden. Symptoms may include: Increased thirst or hunger. Needing to pee (urinate) more often or peeing more at night. Sudden weight changes that you cannot explain. Tiredness (fatigue) or weakness. Vision changes. These may include blurry vision. How is this diagnosed? Type 1 diabetes is diagnosed based on your symptoms, your medical history, a physical exam, and your blood glucose level. Your blood glucose may be checked with: A fasting blood glucose (FBG) test. You will not be allowed to eat (you will fast) for 8 hours or more before a blood sample is taken. A random blood glucose test. This test checks your blood glucose at any time of day no matter  when you last ate. A hemoglobin A1C (A1C) blood test. This shows what your blood glucose levels have been over the last 2-3 months. You may be diagnosed with type 1 diabetes if: Your FBG level is 126 mg/dL (7 mmol/L) or higher. Your random blood glucose level is 200 mg/dL (88.8 mmol/L) or higher. Your A1C level is 6.5% or higher. These blood tests may be done more than once. You may also need other blood tests. How is this treated? You may work with an expert called an endocrinologist to find ways to manage your diabetes. You should also follow instructions from your health care provider. You may need to: Take insulin  every day. This helps to keep your blood glucose levels in the right range. Take medicines to help prevent problems from diabetes. You may need to take: Aspirin. Medicine to lower your cholesterol. Medicine to control your blood pressure. Check your blood glucose as often as told. Make diet and lifestyle changes. You may be told to: Follow a nutrition plan that has been made just for you by a dietitian. Get regular exercise. Find ways to manage stress. Your provider will set treatment goals that are right for you. These goals will be based on your age, other conditions you have, and how you respond to treatment. In general, your A1C level should be less than 7% and your blood glucose levels should be: 80-130 mg/dL (5.5-2.7 mmol/L) before meals (preprandial). Below 180 mg/dL (10 mmol/L) after meals (postprandial). Follow  these instructions at home: Questions to ask your health care provider You may want to ask your provider: Do I need to meet with a certified diabetes care and education specialist? Should I join a support group for people with diabetes? What equipment will I need to manage my diabetes at home? What diabetes medicines should I take, and when? How often should I check my blood glucose? What number should I call if I have questions? When is my next  appointment? General instructions Take over-the-counter and prescription medicines only as told by your provider. Keep all follow-up visits. You will need regular blood tests to make sure the treatments are working. Your provider may adjust your medicines based on your test results. Where to find more information American Diabetes Association (ADA): diabetes.org Association of Diabetes Care and Education Specialists (ADCES): diabeteseducator.org International Diabetes Federation (IDF): http://hill.biz/ Contact a health care provider if: Your blood glucose level is higher than 240 mg/dL (86.6 mmol/L) for 2 days in a row. You have been sick or have had a fever for 2 days or more, and you are not getting better. For more than 6 hours, you: Cannot eat or drink. Have nausea and vomiting. Have diarrhea. Get help right away if: Your blood glucose is less than 54 mg/dL (3 mmol/L). You become confused, or you have trouble thinking clearly. You have trouble breathing. These symptoms may be an emergency. Get help right away. Call 911. Do not wait to see if the symptoms will go away. Do not drive yourself to the hospital. This information is not intended to replace advice given to you by your health care provider. Make sure you discuss any questions you have with your health care provider. Document Revised: 05/06/2022 Document Reviewed: 05/06/2022 Elsevier Patient Education  2024 [employer].

## 2023-10-23 ENCOUNTER — Telehealth: Payer: Self-pay

## 2023-10-23 NOTE — Progress Notes (Signed)
 Care Guide Pharmacy Note  10/23/2023 Name: Howard Calhoun MRN: [id number] DOB: January 06, 1971  Referred By: Norleen Howard ORN, MD Reason for referral: Care Coordination (Outreach to schedule with Pharm d )   Howard Calhoun is a 53 y.o. year old male who is a primary care patient of Janoah, Menna, MD.  Howard Calhoun was referred to the pharmacist for assistance related to: DMII  An unsuccessful telephone outreach was attempted today to contact the patient who was referred to the pharmacy team for assistance with medication management. Additional attempts will be made to contact the patient.  Jeoffrey Buffalo , RMA     [facility]  [facility], [facility] Guide  Direct Dial: [phone]  Website: [email or web address]

## 2023-10-29 NOTE — Progress Notes (Unsigned)
Care Guide Pharmacy Note  10/29/2023 Name: Howard Calhoun MRN: [id number] DOB: May 12, 1971  Referred By: Corwin Levins, MD Reason for referral: Care Coordination (Outreach to schedule with Pharm d )   Howard Calhoun is a 53 y.o. year old male who is a primary care patient of Gill, Delrossi, MD.  [name] was referred to the pharmacist for assistance related to: DMII  A second unsuccessful telephone outreach was attempted today to contact the patient who was referred to the pharmacy team for assistance with medication management. Additional attempts will be made to contact the patient.  Penne Lash , RMA     [facility]  [facility], [facility] Guide  Direct Dial: [phone]  Website: [email or web address]

## 2023-10-30 NOTE — Progress Notes (Signed)
Care Guide Pharmacy Note  10/30/2023 Name: JOESIAH LONON MRN: [id number] DOB: August 10, 1971  Referred By: Corwin Levins, MD Reason for referral: Care Coordination (Outreach to schedule with Pharm d )   RAMONE GANDER is a 53 y.o. year old male who is a primary care patient of Che, Rachal, MD.  [name] was referred to the pharmacist for assistance related to: HTN, HLD, and DMII  A third unsuccessful telephone outreach was attempted today to contact the patient who was referred to the pharmacy team for assistance with medication management. The Population Health team is pleased to engage with this patient at any time in the future upon receipt of referral and should he/she be interested in assistance from the [employer] [facility] team.  Penne Lash , RMA     [facility]  [facility], [facility] Guide  Direct Dial: [phone]  Website: [email or web address]

## 2023-11-04 ENCOUNTER — Other Ambulatory Visit (HOSPITAL_BASED_OUTPATIENT_CLINIC_OR_DEPARTMENT_OTHER): Payer: No Typology Code available for payment source

## 2023-11-05 ENCOUNTER — Ambulatory Visit (INDEPENDENT_AMBULATORY_CARE_PROVIDER_SITE_OTHER): Payer: No Typology Code available for payment source | Admitting: Gastroenterology

## 2023-11-05 ENCOUNTER — Encounter: Payer: Self-pay | Admitting: Gastroenterology

## 2023-11-05 ENCOUNTER — Other Ambulatory Visit: Payer: Self-pay | Admitting: Internal Medicine

## 2023-11-05 VITALS — BP 172/100 | HR 82 | Ht 71.0 in | Wt 229.8 lb

## 2023-11-05 DIAGNOSIS — Z1211 Encounter for screening for malignant neoplasm of colon: Secondary | ICD-10-CM

## 2023-11-05 DIAGNOSIS — R1319 Other dysphagia: Secondary | ICD-10-CM

## 2023-11-05 DIAGNOSIS — I422 Other hypertrophic cardiomyopathy: Secondary | ICD-10-CM | POA: Diagnosis not present

## 2023-11-05 DIAGNOSIS — R1310 Dysphagia, unspecified: Secondary | ICD-10-CM | POA: Diagnosis not present

## 2023-11-05 DIAGNOSIS — I10 Essential (primary) hypertension: Secondary | ICD-10-CM

## 2023-11-05 DIAGNOSIS — I421 Obstructive hypertrophic cardiomyopathy: Secondary | ICD-10-CM

## 2023-11-05 DIAGNOSIS — E139 Other specified diabetes mellitus without complications: Secondary | ICD-10-CM

## 2023-11-05 NOTE — Progress Notes (Unsigned)
 HPI : Howard Calhoun is a 53 y.o. male with a history of diabetes and hypertrophic cardiomyopathy who is referred to [place] by Corwin Levins, MD for further evaluation of dysphagia.    Forcefully vomits food sometimes multiple times a day Worse with meat, breads, pills Weight stable    He was seen by Dr. Christella Hartigan in 2019 for symptoms of dysphagia and was recommended to undergo upper endoscopy, but it does not appear this was completed.        Barium Esophagram Aug 01, 2023 IMPRESSION:  1. Moderate esophageal dysmotility, with moderate of contrast in the  lower esophagus in the standing position. No definite secondary  stripping wave was observed.  2. The ingested 13 mm barium tablet became stuck at the lower  esophageal sphincter.  3. Prominent cricopharyngeal impression   Past Medical History:  Diagnosis Date   Diabetes mellitus without complication (HCC)    Hyperlipidemia    Hypertension      Past Surgical History:  Procedure Laterality Date   APPENDECTOMY  1992   ARTHROSCOPIC REPAIR ACL  1990   TONSILECTOMY/ADENOIDECTOMY WITH MYRINGOTOMY     Family History  Problem Relation Age of Onset   Hypertension Mother    Diabetes Father    Heart disease Father    Kidney disease Father    Hodgkin's lymphoma Father        remission -2 years   Colon cancer Paternal Grandmother    Cancer Neg Hx    Early death Neg Hx    Stroke Neg Hx    Social History   Tobacco Use   Smoking status: Never   Smokeless tobacco: Never  Vaping Use   Vaping status: Never Used  Substance Use Topics   Alcohol use: No    Comment: 1-2 a month   Drug use: No   Current Outpatient Medications  Medication Sig Dispense Refill   amLODipine (NORVASC) 5 MG tablet Take 1 tablet (5 mg total) by mouth daily. 90 tablet 0   atorvastatin (LIPITOR) 40 MG tablet Take 1 tablet (40 mg total) by mouth daily. 90 tablet 3   Continuous Glucose Receiver (FREESTYLE LIBRE 3 READER) DEVI 1 Act by Does not apply  route daily. 1 each 3   Continuous Glucose Sensor (FREESTYLE LIBRE 3 SENSOR) MISC 1 Act by Does not apply route daily. Place 1 sensor on the skin every 14 days. Use to check glucose continuously 2 each 5   Insulin Glargine-Lixisenatide (SOLIQUA) 100-33 UNT-MCG/ML SOPN Inject 60 Units into the skin daily. 54 mL 0   Insulin Pen Needle (NOVOFINE PEN NEEDLE) 32G X 6 MM MISC 1 Act by Does not apply route daily. 100 each 0   metoprolol succinate (TOPROL-XL) 50 MG 24 hr tablet Take 1 tablet (50 mg total) by mouth daily. Take with or immediately following a meal. 90 tablet 3   omeprazole (PRILOSEC) 40 MG capsule Take 1 capsule (40 mg total) by mouth daily. 90 capsule 3   pioglitazone (ACTOS) 30 MG tablet Take 1 tablet (30 mg total) by mouth daily. 90 tablet 3   tadalafil (CIALIS) 20 MG tablet Take 1 tablet (20 mg total) by mouth every other day as needed for erectile dysfunction. 10 tablet 2   No current facility-administered medications for this visit.   Allergies  Allergen Reactions   Metformin And Related Diarrhea     Review of Systems: All systems reviewed and negative except where noted in HPI.    No results  found.  Physical Exam: BP (!) 172/100   Pulse 82   Ht 5\' 11"  (1.803 m)   Wt 229 lb 12.8 oz (104.2 kg)   BMI 32.05 kg/m  Constitutional: Pleasant,well-developed, ***male in no acute distress. HEENT: Normocephalic and atraumatic. Conjunctivae are normal. No scleral icterus. Neck supple.  Cardiovascular: Normal rate, regular rhythm.  Pulmonary/chest: Effort normal and breath sounds normal. No wheezing, rales or rhonchi. Abdominal: Soft, nondistended, nontender. Bowel sounds active throughout. There are no masses palpable. No hepatomegaly. Extremities: no edema Lymphadenopathy: No cervical adenopathy noted. Neurological: Alert and oriented to person place and time. Skin: Skin is warm and dry. No rashes noted. Psychiatric: Normal mood and affect. Behavior is normal.  CBC     Component Value Date/Time   WBC 6.9 07/22/2023 1201   RBC 5.07 07/22/2023 1201   HGB 14.2 07/22/2023 1201   HCT 43.7 07/22/2023 1201   PLT 365.0 07/22/2023 1201   MCV 86.2 07/22/2023 1201   MCHC 32.5 07/22/2023 1201   RDW 13.2 07/22/2023 1201   LYMPHSABS 2.5 07/22/2023 1201   MONOABS 0.5 07/22/2023 1201   EOSABS 0.0 07/22/2023 1201   BASOSABS 0.0 07/22/2023 1201    CMP     Component Value Date/Time   NA 138 07/22/2023 1201   K 4.3 07/22/2023 1201   CL 101 07/22/2023 1201   CO2 30 07/22/2023 1201   GLUCOSE 200 (H) 07/22/2023 1201   BUN 12 07/22/2023 1201   CREATININE 0.87 07/22/2023 1201   CALCIUM 9.6 07/22/2023 1201   PROT 7.2 07/22/2023 1201   ALBUMIN 4.1 07/22/2023 1201   AST 13 07/22/2023 1201   ALT 9 07/22/2023 1201   ALKPHOS 48 07/22/2023 1201   BILITOT 0.7 07/22/2023 1201       Latest Ref Rng & Units 07/22/2023   12:01 PM 06/22/2020   12:29 PM 02/03/2019    3:53 PM  CBC EXTENDED  WBC 4.0 - 10.5 K/uL 6.9  7.0  8.6   RBC 4.22 - 5.81 Mil/uL 5.07  4.89  4.99   Hemoglobin 13.0 - 17.0 g/dL 40.9  81.1  91.4   HCT 39.0 - 52.0 % 43.7  42.5  42.4   Platelets 150.0 - 400.0 K/uL 365.0  350.0  331.0   NEUT# 1.4 - 7.7 K/uL 3.9  4.4  4.7   Lymph# 0.7 - 4.0 K/uL 2.5  2.2  3.1       ASSESSMENT AND PLAN:  Corwin Levins, MD

## 2023-11-05 NOTE — Patient Instructions (Signed)
 You have been scheduled for an endoscopy. Please follow written instructions given to you at your visit today.  If you use inhalers (even only as needed), please bring them with you on the day of your procedure.  If you take any of the following medications, they will need to be adjusted prior to your procedure:   DO NOT TAKE 7 DAYS PRIOR TO TEST- Trulicity (dulaglutide) Ozempic, Wegovy (semaglutide) Mounjaro (tirzepatide) Bydureon Bcise (exanatide extended release)  DO NOT TAKE 1 DAY PRIOR TO YOUR TEST Rybelsus (semaglutide) Adlyxin (lixisenatide) Victoza (liraglutide) Byetta (exanatide) ___________________________________________________________________________   _______________________________________________________  If your blood pressure at your visit was 140/90 or greater, please contact your primary care physician to follow up on this.  _______________________________________________________  If you are age 41 or older, your body mass index should be between 23-30. Your Body mass index is 32.05 kg/m. If this is out of the aforementioned range listed, please consider follow up with your Primary Care Provider.  If you are age 91 or younger, your body mass index should be between 19-25. Your Body mass index is 32.05 kg/m. If this is out of the aformentioned range listed, please consider follow up with your Primary Care Provider.   ________________________________________________________  The [facility] providers would like to encourage you to use [facility] to communicate with providers for non-urgent requests or questions.  Due to long hold times on the telephone, sending your provider a message by [facility] may be a faster and more efficient way to get a response.  Please allow 48 business hours for a response.  Please remember that this is for non-urgent requests.  _______________________________________________________   It was a pleasure to see you today!  Thank you for  trusting me with your gastrointestinal care!    Scott E.Tomasa Rand, MD

## 2023-11-18 ENCOUNTER — Encounter: Payer: Self-pay | Admitting: Internal Medicine

## 2023-11-25 ENCOUNTER — Ambulatory Visit (HOSPITAL_BASED_OUTPATIENT_CLINIC_OR_DEPARTMENT_OTHER): Payer: Self-pay | Attending: Cardiovascular Disease

## 2023-12-11 ENCOUNTER — Ambulatory Visit: Payer: No Typology Code available for payment source | Admitting: Gastroenterology

## 2023-12-11 ENCOUNTER — Encounter: Payer: Self-pay | Admitting: Gastroenterology

## 2023-12-11 VITALS — BP 171/83 | HR 78 | Temp 98.4°F | Resp 18 | Ht 71.0 in | Wt 230.0 lb

## 2023-12-11 DIAGNOSIS — K222 Esophageal obstruction: Secondary | ICD-10-CM | POA: Diagnosis not present

## 2023-12-11 DIAGNOSIS — K209 Esophagitis, unspecified without bleeding: Secondary | ICD-10-CM | POA: Diagnosis not present

## 2023-12-11 DIAGNOSIS — K2289 Other specified disease of esophagus: Secondary | ICD-10-CM | POA: Diagnosis not present

## 2023-12-11 DIAGNOSIS — R1310 Dysphagia, unspecified: Secondary | ICD-10-CM

## 2023-12-11 DIAGNOSIS — K2100 Gastro-esophageal reflux disease with esophagitis, without bleeding: Secondary | ICD-10-CM

## 2023-12-11 DIAGNOSIS — R1319 Other dysphagia: Secondary | ICD-10-CM

## 2023-12-11 MED ORDER — SODIUM CHLORIDE 0.9 % IV SOLN: 500.0000 mL | Freq: Once | INTRAVENOUS | Status: DC

## 2023-12-11 NOTE — Op Note (Signed)
 [facility] Patient Name: Howard Calhoun Procedure Date: 12/11/2023 9:55 AM MRN: [id number] Endoscopist: Lorin Picket E. Tomasa Rand , MD, [id number] Age: 53 Referring MD:  Date of Birth: June 01, 1971 Gender: Male Account #: [id number] Procedure:                Upper GI endoscopy Indications:              Dysphagia Medicines:                Monitored Anesthesia Care Procedure:                Pre-Anesthesia Assessment:                           - Prior to the procedure, a History and Physical                            was performed, and patient medications and                            allergies were reviewed. The patient's tolerance of                            previous anesthesia was also reviewed. The risks                            and benefits of the procedure and the sedation                            options and risks were discussed with the patient.                            All questions were answered, and informed consent                            was obtained. Prior Anticoagulants: The patient has                            taken no anticoagulant or antiplatelet agents. ASA                            Grade Assessment: III - A patient with severe                            systemic disease. After reviewing the risks and                            benefits, the patient was deemed in satisfactory                            condition to undergo the procedure.                           After obtaining informed consent, the endoscope was  passed under direct vision. Throughout the                            procedure, the patient's blood pressure, pulse, and                            oxygen saturations were monitored continuously. The                            Olympus Scope [id number] was introduced through the                            mouth, and advanced to the lower third of                            esophagus. The upper GI endoscopy was  accomplished                            without difficulty. The patient tolerated the                            procedure well. Scope In: Scope Out: Findings:                 One benign-appearing, intrinsic severe stenosis was                            found in the lower third of the esophagus. This                            stenosis measured 6 mm (inner diameter) x less than                            one cm (in length). The stenosis was not traversed.                            A TTS dilator was passed through the scope.                            Dilation with an 04-24-09 mm balloon dilator was                            performed to 8 mm, 9 mm and then 10 mm. The                            dilation site was examined and showed moderate                            mucosal disruption, but the endoscope was still not                            able to traverse the stricture. Estimated blood  loss was minimal.                           A few patchy, white plaques were found in the lower                            third of the esophagus. Biopsies were taken with a                            cold forceps for histology. Estimated blood loss                            was minimal.                           The exam of the esophagus was otherwise normal.                           The examined portions of the nasopharynx,                            oropharynx and larynx were normal. Complications:            No immediate complications. Estimated Blood Loss:     Estimated blood loss was minimal. Impression:               - Benign-appearing esophageal stenosis, likely                            secondary to acid reflux. Dilated.                           - Esophageal plaques were found, doubt candidiasis.                            Biopsied.                           - The examined portions of the nasopharynx,                            oropharynx and larynx were  normal. Recommendation:           - Patient has a contact number available for                            emergencies. The signs and symptoms of potential                            delayed complications were discussed with the                            patient. Return to normal activities tomorrow.                            Written discharge instructions were provided to the  patient.                           - Post-dilation diet.                           - Continue present medications.                           - Await pathology results.                           - Repeat upper endoscopy in 2-4 weeks for repeat                            dilation. Rathana Viveros E. Tomasa Rand, MD 12/11/2023 10:34:33 AM This report has been signed electronically.

## 2023-12-11 NOTE — Progress Notes (Signed)
 Update medical record

## 2023-12-11 NOTE — Progress Notes (Signed)
 Called to room to assist during endoscopic procedure.  Patient ID and intended procedure confirmed with present staff. Received instructions for my participation in the procedure from the performing physician.

## 2023-12-11 NOTE — Progress Notes (Signed)
 Vss nad trans to pacu

## 2023-12-11 NOTE — Patient Instructions (Signed)
YOU HAD AN ENDOSCOPIC PROCEDURE TODAY AT THE  ENDOSCOPY CENTER:   Refer to the procedure report that was given to you for any specific questions about what was found during the examination.  If the procedure report does not answer your questions, please call your gastroenterologist to clarify.  If you requested that your care partner not be given the details of your procedure findings, then the procedure report has been included in a sealed envelope for you to review at your convenience later.  YOU SHOULD EXPECT: Some feelings of bloating in the abdomen. Passage of more gas than usual.  Walking can help get rid of the air that was put into your GI tract during the procedure and reduce the bloating. If you had a lower endoscopy (such as a colonoscopy or flexible sigmoidoscopy) you may notice spotting of blood in your stool or on the toilet paper. If you underwent a bowel prep for your procedure, you may not have a normal bowel movement for a few days.  Please Note:  You might notice some irritation and congestion in your nose or some drainage.  This is from the oxygen used during your procedure.  There is no need for concern and it should clear up in a day or so.  SYMPTOMS TO REPORT IMMEDIATELY:  Following upper endoscopy (EGD)  Vomiting of blood or coffee ground material  New chest pain or pain under the shoulder blades  Painful or persistently difficult swallowing  New shortness of breath  Fever of 100F or higher  Black, tarry-looking stools  For urgent or emergent issues, a gastroenterologist can be reached at any hour by calling [phone]. Do not use MyChart messaging for urgent concerns.    DIET:  Dilation diet today (see handout), but then you may proceed to your regular diet tomorrow.  Drink plenty of fluids but you should avoid alcoholic beverages for 24 hours.  ACTIVITY:  You should plan to take it easy for the rest of today and you should NOT DRIVE or use heavy machinery  until tomorrow (because of the sedation medicines used during the test).    FOLLOW UP: Our staff will call the number listed on your records the next business day following your procedure.  We will call around 7:15- 8:00 am to check on you and address any questions or concerns that you may have regarding the information given to you following your procedure. If we do not reach you, we will leave a message.     If any biopsies were taken you will be contacted by phone or by letter within the next 1-3 weeks.  Please call us at [phone] if you have not heard about the biopsies in 3 weeks.    SIGNATURES/CONFIDENTIALITY: You and/or your care partner have signed paperwork which will be entered into your electronic medical record.  These signatures attest to the fact that that the information above on your After Visit Summary has been reviewed and is understood.  Full responsibility of the confidentiality of this discharge information lies with you and/or your care-partner. 

## 2023-12-11 NOTE — Progress Notes (Signed)
 [facility] History and Physical   Primary Care Physician:  Corwin Levins, MD   Reason for Procedure:   Dysphagia  Plan:    EGD with dilation     HPI: Howard Calhoun is a 53 y.o. male undergoing EGD to evaluate/treat dysphagia.  This has been an issue for several years, but has been particularly bothersome for the past year. A barium swallow in November indicated moderate esophageal dysmotility and the barium tablet stuck at the GEJ.   He denies chronic heartburn or acid reflux symptoms currently, but admits he used to have these issues.   Past Medical History:  Diagnosis Date   Diabetes mellitus without complication (HCC)    Hyperlipidemia    Hypertension     Past Surgical History:  Procedure Laterality Date   APPENDECTOMY  1992   ARTHROSCOPIC REPAIR ACL  1990   TONSILECTOMY/ADENOIDECTOMY WITH MYRINGOTOMY      Prior to Admission medications   Medication Sig Start Date End Date Taking? Authorizing Provider  Continuous Glucose Receiver (FREESTYLE LIBRE 3 READER) DEVI 1 Act by Does not apply route daily. 10/22/23  Yes Etta Grandchild, MD  Continuous Glucose Sensor (FREESTYLE LIBRE 3 SENSOR) MISC 1 Act by Does not apply route daily. Place 1 sensor on the skin every 14 days. Use to check glucose continuously 10/22/23  Yes Etta Grandchild, MD  omeprazole (PRILOSEC) 40 MG capsule Take 1 capsule (40 mg total) by mouth daily. 07/23/23  Yes Corwin Levins, MD  amLODipine (NORVASC) 5 MG tablet Take 1 tablet (5 mg total) by mouth daily. 10/22/23   Etta Grandchild, MD  atorvastatin (LIPITOR) 40 MG tablet Take 1 tablet (40 mg total) by mouth daily. 07/23/23   Corwin Levins, MD  BD PEN NEEDLE MICRO U/F 32G X 6 MM MISC USE FOR INJECTIONS EVERY DAY Patient not taking: Reported on 12/11/2023 11/06/23   Etta Grandchild, MD  Insulin Glargine-Lixisenatide (SOLIQUA) 100-33 UNT-MCG/ML SOPN Inject 60 Units into the skin daily. Patient not taking: Reported on 12/11/2023 10/22/23   Etta Grandchild, MD  metoprolol succinate (TOPROL-XL) 50 MG 24 hr tablet Take 1 tablet (50 mg total) by mouth daily. Take with or immediately following a meal. 07/22/23 07/16/24  Corwin Levins, MD  pioglitazone (ACTOS) 30 MG tablet Take 1 tablet (30 mg total) by mouth daily. 07/23/23   Corwin Levins, MD  tadalafil (CIALIS) 20 MG tablet Take 1 tablet (20 mg total) by mouth every other day as needed for erectile dysfunction. 10/22/23   Etta Grandchild, MD    Current Outpatient Medications  Medication Sig Dispense Refill   Continuous Glucose Receiver (FREESTYLE LIBRE 3 READER) DEVI 1 Act by Does not apply route daily. 1 each 3   Continuous Glucose Sensor (FREESTYLE LIBRE 3 SENSOR) MISC 1 Act by Does not apply route daily. Place 1 sensor on the skin every 14 days. Use to check glucose continuously 2 each 5   omeprazole (PRILOSEC) 40 MG capsule Take 1 capsule (40 mg total) by mouth daily. 90 capsule 3   amLODipine (NORVASC) 5 MG tablet Take 1 tablet (5 mg total) by mouth daily. 90 tablet 0   atorvastatin (LIPITOR) 40 MG tablet Take 1 tablet (40 mg total) by mouth daily. 90 tablet 3   BD PEN NEEDLE MICRO U/F 32G X 6 MM MISC USE FOR INJECTIONS EVERY DAY (Patient not taking: Reported on 12/11/2023) 100 each 0   Insulin Glargine-Lixisenatide (SOLIQUA) 100-33 UNT-MCG/ML SOPN  Inject 60 Units into the skin daily. (Patient not taking: Reported on 12/11/2023) 54 mL 0   metoprolol succinate (TOPROL-XL) 50 MG 24 hr tablet Take 1 tablet (50 mg total) by mouth daily. Take with or immediately following a meal. 90 tablet 3   pioglitazone (ACTOS) 30 MG tablet Take 1 tablet (30 mg total) by mouth daily. 90 tablet 3   tadalafil (CIALIS) 20 MG tablet Take 1 tablet (20 mg total) by mouth every other day as needed for erectile dysfunction. 10 tablet 2   Current Facility-Administered Medications  Medication Dose Route Frequency Provider Last Rate Last Admin   0.9 %  sodium chloride infusion  500 mL Intravenous Once Jenel Lucks, MD         Allergies as of 12/11/2023 - Review Complete 12/11/2023  Allergen Reaction Noted   Metformin and related Diarrhea 04/30/2018    Family History  Problem Relation Age of Onset   Hypertension Mother    Diabetes Father    Heart disease Father    Kidney disease Father    Hodgkin's lymphoma Father        remission -2 years   Colon cancer Paternal Grandmother    Cancer Neg Hx    Early death Neg Hx    Stroke Neg Hx    Esophageal cancer Neg Hx    Rectal cancer Neg Hx    Stomach cancer Neg Hx     Social History   Socioeconomic History   Marital status: Married    Spouse name: Not on file   Number of children: 3   Years of education: Not on file   Highest education level: Some college, no degree  Occupational History   Occupation: Education officer, environmental  Tobacco Use   Smoking status: Never   Smokeless tobacco: Never  Vaping Use   Vaping status: Never Used  Substance and Sexual Activity   Alcohol use: No    Comment: 1-2 a month   Drug use: No   Sexual activity: Yes    Birth control/protection: None  Other Topics Concern   Not on file  Social History Narrative   Not on file   Social Drivers of Health   Financial Resource Strain: Low Risk  (07/22/2023)   Overall Financial Resource Strain (CARDIA)    Difficulty of Paying Living Expenses: Not hard at all  Food Insecurity: No Food Insecurity (07/22/2023)   Hunger Vital Sign    Worried About Running Out of Food in the Last Year: Never true    Ran Out of Food in the Last Year: Never true  Transportation Needs: No Transportation Needs (07/22/2023)   PRAPARE - Administrator, Civil Service (Medical): No    Lack of Transportation (Non-Medical): No  Physical Activity: Unknown (07/22/2023)   Exercise Vital Sign    Days of Exercise per Week: 0 days    Minutes of Exercise per Session: Not on file  Stress: No Stress Concern Present (07/22/2023)   [employer] of Occupational Health - Occupational Stress  Questionnaire    Feeling of Stress : Not at all  Social Connections: Unknown (07/22/2023)   Social Connection and Isolation Panel [NHANES]    Frequency of Communication with Friends and Family: Once a week    Frequency of Social Gatherings with Friends and Family: Patient declined    Attends Religious Services: More than 4 times per year    Active Member of [employer] or Organizations: No    Attends Banker  Meetings: Not on file    Marital Status: Married  Intimate Partner Violence: Not on file    Review of Systems:  All other review of systems negative except as mentioned in the HPI.  Physical Exam: Vital signs BP (!) 160/98 (BP Location: Left Arm)   Pulse 72   Temp 98.4 F (36.9 C) (Temporal)   Ht 5\' 11"  (1.803 m)   Wt 230 lb (104.3 kg)   SpO2 100%   BMI 32.08 kg/m   General:   Alert,  Well-developed, well-nourished, pleasant and cooperative in NAD Airway:  Mallampati 3 Lungs:  Clear throughout to auscultation.   Heart:  Regular rate and rhythm; no murmurs, clicks, rubs,  or gallops. Abdomen:  Soft, nontender and nondistended. Normal bowel sounds.   Neuro/Psych:  Normal mood and affect. A and O x 3   Arionne Iams E. Tomasa Rand, MD [facility]

## 2023-12-12 ENCOUNTER — Telehealth: Payer: Self-pay

## 2023-12-12 NOTE — Telephone Encounter (Signed)
 LMOM

## 2023-12-16 LAB — SURGICAL PATHOLOGY

## 2023-12-22 ENCOUNTER — Encounter: Payer: Self-pay | Admitting: Gastroenterology

## 2023-12-22 NOTE — Progress Notes (Signed)
 Howard Calhoun, The biopsies of your esophagus showed acute inflammatory changes consistent with acid reflux damage.  No evidence of fungal infection or eosinophilic esophagitis was noted. Please take the omeprazole and plan for repeat upper endoscopy with dilation later this month

## 2023-12-25 ENCOUNTER — Ambulatory Visit (AMBULATORY_SURGERY_CENTER)

## 2023-12-25 VITALS — Ht 71.0 in | Wt 232.0 lb

## 2023-12-25 DIAGNOSIS — K222 Esophageal obstruction: Secondary | ICD-10-CM

## 2023-12-25 DIAGNOSIS — Z1211 Encounter for screening for malignant neoplasm of colon: Secondary | ICD-10-CM

## 2023-12-25 DIAGNOSIS — R1319 Other dysphagia: Secondary | ICD-10-CM

## 2023-12-25 MED ORDER — NA SULFATE-K SULFATE-MG SULF 17.5-3.13-1.6 GM/177ML PO SOLN
1.0000 | Freq: Once | ORAL | 0 refills | Status: AC
Start: 2023-12-25 — End: 2023-12-25

## 2023-12-25 NOTE — Progress Notes (Signed)
 No egg or soy allergy known to patient  No issues known to pt with past sedation with any surgeries or procedures Patient denies ever being told they had issues or difficulty with intubation  No FH of Malignant Hyperthermia Pt is not on diet pills Pt is not on  home 02  Pt is not on blood thinners  Pt reports some issues constipation  No A fib or A flutter Have any cardiac testing pending-- no  LOA: independent  Prep: suprep  Patient's chart reviewed by Cathlyn Parsons CNRA prior to previsit and patient appropriate for the [facility].  Previsit completed and red dot placed by patient's name on their procedure day (on provider's schedule).     PV completed with patient. Prep instructions sent via mychart and home address.

## 2024-01-01 ENCOUNTER — Telehealth: Payer: Self-pay | Admitting: Gastroenterology

## 2024-01-01 NOTE — Telephone Encounter (Signed)
 Patient called and stated that he has a colonoscopy procedure schedule for April the 22 nd. Patient is requesting a to speak to the nurse regarding his instruction for the prep that he was given. Please advise.

## 2024-01-01 NOTE — Telephone Encounter (Signed)
 Spoke with patient and reviewed all prep instructions through MyChart.  Patient verbally understood all instructions.

## 2024-01-06 ENCOUNTER — Encounter: Payer: Self-pay | Admitting: Gastroenterology

## 2024-01-06 ENCOUNTER — Ambulatory Visit: Admitting: Gastroenterology

## 2024-01-06 VITALS — BP 151/91 | HR 85 | Temp 98.3°F | Resp 15 | Ht 71.0 in | Wt 232.0 lb

## 2024-01-06 DIAGNOSIS — R1319 Other dysphagia: Secondary | ICD-10-CM

## 2024-01-06 DIAGNOSIS — K209 Esophagitis, unspecified without bleeding: Secondary | ICD-10-CM | POA: Diagnosis not present

## 2024-01-06 DIAGNOSIS — Z1211 Encounter for screening for malignant neoplasm of colon: Secondary | ICD-10-CM

## 2024-01-06 DIAGNOSIS — K640 First degree hemorrhoids: Secondary | ICD-10-CM

## 2024-01-06 DIAGNOSIS — K222 Esophageal obstruction: Secondary | ICD-10-CM

## 2024-01-06 DIAGNOSIS — D125 Benign neoplasm of sigmoid colon: Secondary | ICD-10-CM

## 2024-01-06 MED ORDER — SODIUM CHLORIDE 0.9 % IV SOLN: 500.0000 mL | Freq: Once | INTRAVENOUS | Status: DC

## 2024-01-06 NOTE — Patient Instructions (Signed)
 Please read handouts provided. Continue present medications. Increase omeprazole  to twice daily. Open capsule and sprinkle on applesauce followed by several sips of water. Await pathology results. Repeat upper endoscopy in several weeks (  Friday Jan 23, 2024 ). Dilation Diet.   YOU HAD AN ENDOSCOPIC PROCEDURE TODAY AT THE [facility]:   Refer to the procedure report that was given to you for any specific questions about what was found during the examination.  If the procedure report does not answer your questions, please call your gastroenterologist to clarify.  If you requested that your care partner not be given the details of your procedure findings, then the procedure report has been included in a sealed envelope for you to review at your convenience later.  YOU SHOULD EXPECT: Some feelings of bloating in the abdomen. Passage of more gas than usual.  Walking can help get rid of the air that was put into your GI tract during the procedure and reduce the bloating. If you had a lower endoscopy (such as a colonoscopy or flexible sigmoidoscopy) you may notice spotting of blood in your stool or on the toilet paper. If you underwent a bowel prep for your procedure, you may not have a normal bowel movement for a few days.  Please Note:  You might notice some irritation and congestion in your nose or some drainage.  This is from the oxygen used during your procedure.  There is no need for concern and it should clear up in a day or so.  SYMPTOMS TO REPORT IMMEDIATELY:  Following lower endoscopy (colonoscopy or flexible sigmoidoscopy):  Excessive amounts of blood in the stool  Significant tenderness or worsening of abdominal pains  Swelling of the abdomen that is new, acute  Fever of 100F or higher  Following upper endoscopy (EGD)  Vomiting of blood or coffee ground material  New chest pain or pain under the shoulder blades  Painful or persistently difficult swallowing  New shortness  of breath  Fever of 100F or higher  Black, tarry-looking stools  For urgent or emergent issues, a gastroenterologist can be reached at any hour by calling [phone]. Do not use MyChart messaging for urgent concerns.    DIET:   Drink plenty of fluids but you should avoid alcoholic beverages for 24 hours.  ACTIVITY:  You should plan to take it easy for the rest of today and you should NOT DRIVE or use heavy machinery until tomorrow (because of the sedation medicines used during the test).    FOLLOW UP: Our staff will call the number listed on your records the next business day following your procedure.  We will call around 7:15- 8:00 am to check on you and address any questions or concerns that you may have regarding the information given to you following your procedure. If we do not reach you, we will leave a message.     If any biopsies were taken you will be contacted by phone or by letter within the next 1-3 weeks.  Please call us  at [phone] if you have not heard about the biopsies in 3 weeks.    SIGNATURES/CONFIDENTIALITY: You and/or your care partner have signed paperwork which will be entered into your electronic medical record.  These signatures attest to the fact that that the information above on your After Visit Summary has been reviewed and is understood.  Full responsibility of the confidentiality of this discharge information lies with you and/or your care-partner.

## 2024-01-06 NOTE — Progress Notes (Signed)
 [facility] History and Physical   Primary Care Physician:  Roslyn Coombe, MD   Reason for Procedure:   Dilation of esophageal stricture, colon cancer screening  Plan:    EGD, colonoscopy     HPI: Howard Calhoun is a 53 y.o. male undergoing initial average risk screening colonoscopy.  He has no family history of colon cancer (other than grandparent) and no chronic lower GI symptoms.  He also has had pill and solid dysphagia for the past year and underwent EGD in March which was notable for a severe distal esophageal stricture which was dilated to 10 mm with good post-dilation effect, but still was not traversable with the standard gastroscope.  He reports no improvement in his dysphagia following the dilation.   Past Medical History:  Diagnosis Date   Diabetes mellitus without complication (HCC)    Hyperlipidemia    Hypertension     Past Surgical History:  Procedure Laterality Date   APPENDECTOMY  1992   ARTHROSCOPIC REPAIR ACL  1990   TONSILECTOMY/ADENOIDECTOMY WITH MYRINGOTOMY      Prior to Admission medications   Medication Sig Start Date End Date Taking? Authorizing Provider  Continuous Glucose Receiver (FREESTYLE LIBRE 3 READER) DEVI 1 Act by Does not apply route daily. 10/22/23  Yes Arcadio Knuckles, MD  Continuous Glucose Sensor (FREESTYLE LIBRE 3 SENSOR) MISC 1 Act by Does not apply route daily. Place 1 sensor on the skin every 14 days. Use to check glucose continuously 10/22/23  Yes Arcadio Knuckles, MD  metoprolol  succinate (TOPROL -XL) 50 MG 24 hr tablet Take 1 tablet (50 mg total) by mouth daily. Take with or immediately following a meal. 07/22/23 07/16/24 Yes Roslyn Coombe, MD  amLODipine  (NORVASC ) 5 MG tablet Take 1 tablet (5 mg total) by mouth daily. Patient not taking: Reported on 01/06/2024 10/22/23   Arcadio Knuckles, MD  atorvastatin  (LIPITOR) 40 MG tablet Take 1 tablet (40 mg total) by mouth daily. Patient not taking: Reported on 01/06/2024 07/23/23   Roslyn Coombe, MD  BD PEN NEEDLE MICRO U/F 32G X 6 MM MISC USE FOR INJECTIONS EVERY DAY Patient not taking: Reported on 01/06/2024 11/06/23   Arcadio Knuckles, MD  Insulin  Glargine-Lixisenatide (SOLIQUA ) 100-33 UNT-MCG/ML SOPN Inject 60 Units into the skin daily. Patient not taking: Reported on 12/11/2023 10/22/23   Arcadio Knuckles, MD  omeprazole  (PRILOSEC) 40 MG capsule Take 1 capsule (40 mg total) by mouth daily. 07/23/23   Roslyn Coombe, MD  pioglitazone  (ACTOS ) 30 MG tablet Take 1 tablet (30 mg total) by mouth daily. Patient not taking: Reported on 01/06/2024 07/23/23   Roslyn Coombe, MD  tadalafil  (CIALIS ) 20 MG tablet Take 1 tablet (20 mg total) by mouth every other day as needed for erectile dysfunction. Patient not taking: Reported on 01/06/2024 10/22/23   Arcadio Knuckles, MD    Current Outpatient Medications  Medication Sig Dispense Refill   Continuous Glucose Receiver (FREESTYLE LIBRE 3 READER) DEVI 1 Act by Does not apply route daily. 1 each 3   Continuous Glucose Sensor (FREESTYLE LIBRE 3 SENSOR) MISC 1 Act by Does not apply route daily. Place 1 sensor on the skin every 14 days. Use to check glucose continuously 2 each 5   metoprolol  succinate (TOPROL -XL) 50 MG 24 hr tablet Take 1 tablet (50 mg total) by mouth daily. Take with or immediately following a meal. 90 tablet 3   amLODipine  (NORVASC ) 5 MG tablet Take 1 tablet (5  mg total) by mouth daily. (Patient not taking: Reported on 01/06/2024) 90 tablet 0   atorvastatin  (LIPITOR) 40 MG tablet Take 1 tablet (40 mg total) by mouth daily. (Patient not taking: Reported on 01/06/2024) 90 tablet 3   BD PEN NEEDLE MICRO U/F 32G X 6 MM MISC USE FOR INJECTIONS EVERY DAY (Patient not taking: Reported on 01/06/2024) 100 each 0   Insulin  Glargine-Lixisenatide (SOLIQUA ) 100-33 UNT-MCG/ML SOPN Inject 60 Units into the skin daily. (Patient not taking: Reported on 12/11/2023) 54 mL 0   omeprazole  (PRILOSEC) 40 MG capsule Take 1 capsule (40 mg total) by mouth daily. 90  capsule 3   pioglitazone  (ACTOS ) 30 MG tablet Take 1 tablet (30 mg total) by mouth daily. (Patient not taking: Reported on 01/06/2024) 90 tablet 3   tadalafil  (CIALIS ) 20 MG tablet Take 1 tablet (20 mg total) by mouth every other day as needed for erectile dysfunction. (Patient not taking: Reported on 01/06/2024) 10 tablet 2   Current Facility-Administered Medications  Medication Dose Route Frequency Provider Last Rate Last Admin   0.9 %  sodium chloride  infusion  500 mL Intravenous Once Elois Hair, MD        Allergies as of 01/06/2024 - Review Complete 01/06/2024  Allergen Reaction Noted   Metformin  and related Diarrhea 04/30/2018    Family History  Problem Relation Age of Onset   Hypertension Mother    Diabetes Father    Heart disease Father    Kidney disease Father    Hodgkin's lymphoma Father        remission -2 years   Colon cancer Paternal Grandmother    Cancer Neg Hx    Early death Neg Hx    Stroke Neg Hx    Esophageal cancer Neg Hx    Rectal cancer Neg Hx    Stomach cancer Neg Hx     Social History   Socioeconomic History   Marital status: Married    Spouse name: Not on file   Number of children: 3   Years of education: Not on file   Highest education level: Some college, no degree  Occupational History   Occupation: Education officer, environmental  Tobacco Use   Smoking status: Never   Smokeless tobacco: Never  Vaping Use   Vaping status: Never Used  Substance and Sexual Activity   Alcohol use: No    Comment: 1-2 a month   Drug use: No   Sexual activity: Yes    Birth control/protection: None  Other Topics Concern   Not on file  Social History Narrative   Not on file   Social Drivers of Health   Financial Resource Strain: Low Risk  (07/22/2023)   Overall Financial Resource Strain (CARDIA)    Difficulty of Paying Living Expenses: Not hard at all  Food Insecurity: No Food Insecurity (07/22/2023)   Hunger Vital Sign    Worried About Running Out of Food in  the Last Year: Never true    Ran Out of Food in the Last Year: Never true  Transportation Needs: No Transportation Needs (07/22/2023)   PRAPARE - Administrator, Civil Service (Medical): No    Lack of Transportation (Non-Medical): No  Physical Activity: Unknown (07/22/2023)   Exercise Vital Sign    Days of Exercise per Week: 0 days    Minutes of Exercise per Session: Not on file  Stress: No Stress Concern Present (07/22/2023)   [employer] of Occupational Health - Occupational Stress Questionnaire  Feeling of Stress : Not at all  Social Connections: Unknown (07/22/2023)   Social Connection and Isolation Panel [NHANES]    Frequency of Communication with Friends and Family: Once a week    Frequency of Social Gatherings with Friends and Family: Patient declined    Attends Religious Services: More than 4 times per year    Active Member of [employer] or Organizations: No    Attends Engineer, structural: Not on file    Marital Status: Married  Catering manager Violence: Not on file    Review of Systems:  All other review of systems negative except as mentioned in the HPI.  Physical Exam: Vital signs BP (!) 158/106   Pulse 96   Temp 98.3 F (36.8 C)   Ht 5\' 11"  (1.803 m)   Wt 232 lb (105.2 kg)   SpO2 98%   BMI 32.36 kg/m   General:   Alert,  Well-developed, well-nourished, pleasant and cooperative in NAD Airway:  Mallampati 3 Lungs:  Clear throughout to auscultation.   Heart:  Regular rate and rhythm; no murmurs, clicks, rubs,  or gallops. Abdomen:  Soft, nontender and nondistended. Normal bowel sounds.   Neuro/Psych:  Normal mood and affect. A and O x 3   Howard Harlan E. Cherryl Corona, MD [facility]

## 2024-01-06 NOTE — Progress Notes (Signed)
 Report to PACU, RN, vss, BBS= Clear.

## 2024-01-06 NOTE — Op Note (Signed)
 [facility] Patient Name: Howard Calhoun Procedure Date: 01/06/2024 2:12 PM MRN: [id number] Endoscopist: Geralyn Knee E. Cherryl Corona , MD, [id number] Age: 53 Referring MD:  Date of Birth: September 22, 1970 Gender: Male Account #: [id number] Procedure:                Colonoscopy Indications:              Screening for colorectal malignant neoplasm, This                            is the patient's first colonoscopy Medicines:                Monitored Anesthesia Care Procedure:                Pre-Anesthesia Assessment:                           - Prior to the procedure, a History and Physical                            was performed, and patient medications and                            allergies were reviewed. The patient's tolerance of                            previous anesthesia was also reviewed. The risks                            and benefits of the procedure and the sedation                            options and risks were discussed with the patient.                            All questions were answered, and informed consent                            was obtained. Prior Anticoagulants: The patient has                            taken no anticoagulant or antiplatelet agents. ASA                            Grade Assessment: III - A patient with severe                            systemic disease. After reviewing the risks and                            benefits, the patient was deemed in satisfactory                            condition to undergo the procedure.  After obtaining informed consent, the colonoscope                            was passed under direct vision. Throughout the                            procedure, the patient's blood pressure, pulse, and                            oxygen saturations were monitored continuously. The                            CF [id number] #[id number] was introduced through the anus                            and advanced  to the the cecum, identified by                            appendiceal orifice and ileocecal valve. The                            colonoscopy was somewhat difficult due to                            significant looping. Successful completion of the                            procedure was aided by using manual pressure and                            withdrawing and reinserting the scope. The patient                            tolerated the procedure well. The quality of the                            bowel preparation was adequate. The ileocecal                            valve, appendiceal orifice, and rectum were                            photographed. The bowel preparation used was                            SUFLAVE via split dose instruction. Scope In: 2:53:30 PM Scope Out: 3:12:45 PM Scope Withdrawal Time: 0 hours 15 minutes 31 seconds  Total Procedure Duration: 0 hours 19 minutes 15 seconds  Findings:                 The perianal and digital rectal examinations were                            normal. Pertinent negatives include normal  sphincter tone and no palpable rectal lesions.                           A 9 mm polyp was found in the sigmoid colon. The                            polyp was pedunculated. The polyp was removed with                            a cold snare. Resection and retrieval were                            complete. Estimated blood loss was minimal.                           The exam was otherwise normal throughout the                            examined colon.                           Non-bleeding internal hemorrhoids were found during                            retroflexion. The hemorrhoids were large and Grade                            I (internal hemorrhoids that do not prolapse).                           No additional abnormalities were found on                            retroflexion. Complications:            No immediate  complications. Estimated Blood Loss:     Estimated blood loss was minimal. Impression:               - One 9 mm polyp in the sigmoid colon, removed with                            a cold snare. Resected and retrieved.                           - Non-bleeding internal hemorrhoids. Recommendation:           - Patient has a contact number available for                            emergencies. The signs and symptoms of potential                            delayed complications were discussed with the                            patient. Return  to normal activities tomorrow.                            Written discharge instructions were provided to the                            patient.                           - Resume previous diet.                           - Continue present medications.                           - Await pathology results.                           - Repeat colonoscopy (date not yet determined) for                            surveillance based on pathology results. Ndidi Nesby E. Cherryl Corona, MD 01/06/2024 3:31:54 PM This report has been signed electronically.

## 2024-01-06 NOTE — Progress Notes (Signed)
 Called to room to assist during endoscopic procedure.  Patient ID and intended procedure confirmed with present staff. Received instructions for my participation in the procedure from the performing physician.

## 2024-01-06 NOTE — Op Note (Signed)
 [facility] Patient Name: Howard Calhoun Procedure Date: 01/06/2024 2:19 PM MRN: [id number] Endoscopist: Geralyn Knee E. Cherryl Calhoun , MD, [id number] Age: 53 Referring MD:  Date of Birth: [birth year] Gender: Male Account #: [id number] Procedure:                Upper GI endoscopy Indications:              For therapy of esophageal stenosis Medicines:                Monitored Anesthesia Care Procedure:                Pre-Anesthesia Assessment:                           - Prior to the procedure, a History and Physical                            was performed, and patient medications and                            allergies were reviewed. The patient's tolerance of                            previous anesthesia was also reviewed. The risks                            and benefits of the procedure and the sedation                            options and risks were discussed with the patient.                            All questions were answered, and informed consent                            was obtained. Prior Anticoagulants: The patient has                            taken no anticoagulant or antiplatelet agents. ASA                            Grade Assessment: III - A patient with severe                            systemic disease. After reviewing the risks and                            benefits, the patient was deemed in satisfactory                            condition to undergo the procedure.                           After obtaining informed consent, the endoscope was  passed under direct vision. Throughout the                            procedure, the patient's blood pressure, pulse, and                            oxygen saturations were monitored continuously. The                            Olympus Scope [id number] was introduced through the                            mouth, and advanced to the second part of duodenum.                            The  upper GI endoscopy was accomplished without                            difficulty. The patient tolerated the procedure                            well. Scope In: Scope Out: Findings:                 The examined portions of the nasopharynx,                            oropharynx and larynx were normal.                           One benign-appearing, intrinsic severe stenosis was                            found at the gastroesophageal junction. This                            stenosis measured 8 mm (inner diameter) x less than                            one cm (in length) and was not traversable. The                            gastric lumen and gastric folds were visible on the                            other side of the stenosis A TTS dilator was passed                            through the scope. Dilation with an 04-24-09 mm                            balloon was performed with modest post-dilation  effect, but the endoscope still was not able to                            traverse the stenosis. Next, a 06-27-11 mm balloon                            dilator was performed to 11 mm. The dilation site                            was examined and showed moderate mucosal                            disruption. The stenosis was then traversed with                            minimal resistance. Estimated blood loss was                            minimal. Biopsies of the stenosis were taken with a                            cold forceps for histology.                           Diffuse mild mucosal changes characterized by a                            decreased vascular pattern were found in the entire                            esophagus. Biopsies were obtained from the proximal                            and distal esophagus with cold forceps for                            histology of suspected eosinophilic esophagitis.                            Estimated blood loss  was minimal.                           The entire examined stomach was normal.                           The examined duodenum was normal. Complications:            No immediate complications. Estimated Blood Loss:     Estimated blood loss was minimal. Impression:               - The examined portions of the nasopharynx,                            oropharynx and larynx were normal.                           -  Benign-appearing esophageal stenosis. Dilated.                            Stenosis most likely secondary to acid reflux.                           - Decreased vascular pattern mucosa in the                            esophagus. Likely reflux related, biopsied to                            exclude eosinophilic esophagitis.                           - Normal stomach.                           - Normal examined duodenum. Recommendation:           - Patient has a contact number available for                            emergencies. The signs and symptoms of potential                            delayed complications were discussed with the                            patient. Return to normal activities tomorrow.                            Written discharge instructions were provided to the                            patient.                           - Clear liquid diet for 2 hours, then advance to                            full liquids if no pain with swallowing. Advance to                            regular diet tomorrow if no pain with swallowing                           - Continue present medications.                           - Increase omeprazole  to twice daily. Open capsule                            and sprinkle on apple sauce followed by several  sips of water.                           - Await pathology results.                           - Repeat upper endoscopy in 2-3 weeks for repeat                            dilation. Howard Kane E. Cherryl Corona,  MD 01/06/2024 3:28:07 PM This report has been signed electronically.

## 2024-01-06 NOTE — Progress Notes (Signed)
 Patent states nothing has changed with his medical history since the pre-visit.

## 2024-01-07 ENCOUNTER — Telehealth: Payer: Self-pay

## 2024-01-07 ENCOUNTER — Encounter: Payer: Self-pay | Admitting: Gastroenterology

## 2024-01-07 NOTE — Telephone Encounter (Signed)
 No answer, left message to call if having any issues or concerns, B.Vale Mousseau RN

## 2024-01-09 LAB — SURGICAL PATHOLOGY

## 2024-01-13 ENCOUNTER — Encounter: Payer: Self-pay | Admitting: Gastroenterology

## 2024-01-16 ENCOUNTER — Encounter: Payer: Self-pay | Admitting: Gastroenterology

## 2024-01-16 NOTE — Progress Notes (Signed)
 Howard Calhoun, The biopsies taken of your esophageal stricture and esophagus showed inflammatory changes most consistent with inflammation from acid reflux.  There was no evidence of eosinophilic esophagitis or infection.  Also no evidence of any cancerous or precancerous changes.  As discussed, high-grade esophageal strictures such as yours typically require serial dilations, progressively increasing the dilation size at each interval.  Please plan to follow-up with repeat dilation next week as scheduled.  The polyp which I removed from your colon was proven to be completely benign but is considered a "pre-cancerous" polyp that MAY have grown into cancer if it had not been removed.  Studies shows that at least 20% of women over age 32 and 30% of men over age 37 have pre-cancerous polyps.  Based on current nationally recognized surveillance guidelines, I recommend that you have a repeat colonoscopy in 7 years.

## 2024-01-23 ENCOUNTER — Telehealth: Payer: Self-pay | Admitting: Gastroenterology

## 2024-01-23 ENCOUNTER — Encounter: Admitting: Gastroenterology

## 2024-01-23 NOTE — Telephone Encounter (Signed)
 PT called to cancel EGD for today at 230pm

## 2024-01-26 NOTE — Telephone Encounter (Signed)
 Phoned pt.  He states that he needs to get things sorted out with his insurance prior to rescheduling EGD.  He will call back when he is ready to schedule.

## 2024-01-26 NOTE — Telephone Encounter (Signed)
 He only spoke with one of the schedulers.  The note just says "unable to make it". Would you like for us  to call him today to ask about rescheduling?

## 2024-03-18 ENCOUNTER — Other Ambulatory Visit: Payer: Self-pay

## 2024-03-18 ENCOUNTER — Ambulatory Visit (HOSPITAL_COMMUNITY)
Admission: EM | Admit: 2024-03-18 | Discharge: 2024-03-18 | Disposition: A | Discharge status: Home / Self Care | Attending: Family Medicine | Admitting: Family Medicine

## 2024-03-18 ENCOUNTER — Encounter (HOSPITAL_COMMUNITY): Payer: Self-pay | Admitting: Emergency Medicine

## 2024-03-18 DIAGNOSIS — I10 Essential (primary) hypertension: Secondary | ICD-10-CM | POA: Diagnosis not present

## 2024-03-18 DIAGNOSIS — E1165 Type 2 diabetes mellitus with hyperglycemia: Secondary | ICD-10-CM | POA: Diagnosis not present

## 2024-03-18 DIAGNOSIS — Z794 Long term (current) use of insulin: Secondary | ICD-10-CM | POA: Diagnosis not present

## 2024-03-18 DIAGNOSIS — M545 Low back pain, unspecified: Secondary | ICD-10-CM | POA: Diagnosis not present

## 2024-03-18 LAB — POCT FASTING CBG KUC MANUAL ENTRY: POCT Glucose (KUC): 239 mg/dL — AB (ref 70–99)

## 2024-03-18 LAB — POCT URINALYSIS DIP (MANUAL ENTRY)
Bilirubin, UA: NEGATIVE
Glucose, UA: 250 mg/dL — AB
Ketones, POC UA: NEGATIVE mg/dL
Leukocytes, UA: NEGATIVE
Nitrite, UA: NEGATIVE
Protein Ur, POC: 100 mg/dL — AB
Spec Grav, UA: >=1.03 — AB (ref 1.010–1.025)
Urobilinogen, UA: 1 U/dL
pH, UA: 5.5 (ref 5.0–8.0)

## 2024-03-18 MED ORDER — MELOXICAM 15 MG PO TABS: 15.0000 mg | ORAL_TABLET | Freq: Every day | ORAL | 0 refills | Status: AC

## 2024-03-18 MED ORDER — METHOCARBAMOL 500 MG PO TABS: 500.0000 mg | ORAL_TABLET | Freq: Two times a day (BID) | ORAL | 0 refills | Status: AC

## 2024-03-18 NOTE — ED Provider Notes (Signed)
 [facility]   [id number] 03/18/24 Arrival Time: 1505  ASSESSMENT & PLAN:  1. Acute bilateral low back pain without sciatica   2. Type 2 diabetes mellitus with hyperglycemia, with long-term current use of insulin  (HCC)   3. Elevated blood pressure reading in office with diagnosis of hypertension    Back pain sounds MSK in nature. Without LE neurological abnormalities.  Able to ambulate here and hemodynamically stable. No indication for imaging of back at this time given no trauma and normal neurological exam. Discussed.  Meds ordered this encounter  Medications   meloxicam (MOBIC) 15 MG tablet    Sig: Take 1 tablet (15 mg total) by mouth daily.    Dispense:  14 tablet    Refill:  0   methocarbamol (ROBAXIN) 500 MG tablet    Sig: Take 1 tablet (500 mg total) by mouth 2 (two) times daily.    Dispense:  28 tablet    Refill:  0   Encourage ROM/movement as tolerated.  Recommend:  Follow-up Information     Schedule an appointment as soon as possible for a visit  with Norleen Lynwood ORN, MD.   Specialties: Internal Medicine, Radiology Why: For follow up to discuss your diabetes and high blood pressure. Contact information: [address] [place] [address] [phone]                  Discharge Instructions       POCT FASTING CBG [facility] MANUAL ENTRY - Abnormal; Notable for the following components:   POCT Glucose (KUC) 239 (*)    All other components within normal limits   Your blood pressure was noted to be elevated during your visit today. If you are currently taking medication for high blood pressure, please ensure you are taking this as directed. If you do not have a history of high blood pressure and your blood pressure remains persistently elevated, you may need to begin taking a medication at some point. You may return here within the next few days to recheck if unable to see your primary care provider or if you do not have a one.  BP (!) 161/100  (BP Location: Right Arm)   Pulse 88   Temp 98.5 F (36.9 C) (Oral)   Resp 18   SpO2 96%   BP Readings from Last 3 Encounters:  03/18/24 (!) 161/100  01/06/24 (!) 151/91  12/11/23 (!) 171/83        Reviewed expectations re: course of current medical issues. Questions answered. Outlined signs and symptoms indicating need for more acute intervention. Patient verbalized understanding. After Visit Summary given.   SUBJECTIVE: History from: patient.  Howard Calhoun is a 53 y.o. male who presents with complaint of fairly persistent bilateral lower back discomfort. Onset gradual. First noted about 2 weeks ago.SABRA Christian: denied. History of back problems requiring medical care: rare. Pain described as aching; also feels pain in his thighs. Aggravating factors: have not been identified. Alleviating factors: have not been identified. Worse at night. Progressive LE weakness or saddle anesthesia: denied. Extremity sensation changes or weakness: denied. Ambulatory without difficulty. Normal bowel/bladder habits. Normal PO intake without n/v. No associated abdominal pain/n/v. Tylenol with mild help.  Increased blood pressure noted today. Reports that he is tx for HTN. Taking meds.   OBJECTIVE:  Vitals:   03/18/24 1608  BP: (!) 161/100  Pulse: 88  Resp: 18  Temp: 98.5 F (36.9 C)  TempSrc: Oral  SpO2: 96%  General appearance: alert; no distress HEENT: [place]; AT Neck: supple with FROM; without midline tenderness Lungs: unlabored respirations; speaks full sentences without difficulty Back: generalized bilateral lumbar paraspinal muscular TTP; FROM at waist; bruising: none; without midline tenderness Extremities: without edema; symmetrical without gross deformities; normal ROM of bilateral LE Skin: warm and dry Neurologic: normal gait; normal sensation and strength of bilateral LE Psychological: alert and cooperative; normal mood and affect  Labs: Results for orders placed or  performed during the hospital encounter of 03/18/24  POC CBG monitoring   Collection Time: 03/18/24  5:53 PM  Result Value Ref Range   POCT Glucose ([facility]UC) 239 (A) 70 - 99 mg/dL  POC urinalysis dipstick   Collection Time: 03/18/24  6:00 PM  Result Value Ref Range   Color, UA yellow yellow   Clarity, UA cloudy (A) clear   Glucose, UA =250 (A) negative mg/dL   Bilirubin, UA negative negative   Ketones, POC UA negative negative mg/dL   Spec Grav, UA >=8.969 (A) 1.010 - 1.025   Blood, UA trace-lysed (A) negative   pH, UA 5.5 5.0 - 8.0   Protein Ur, POC =100 (A) negative mg/dL   Urobilinogen, UA 1.0 0.2 or 1.0 E.U./dL   Nitrite, UA Negative Negative   Leukocytes, UA Negative Negative   Labs Reviewed  POCT URINALYSIS DIP (MANUAL ENTRY) - Abnormal; Notable for the following components:      Result Value   Clarity, UA cloudy (*)    Glucose, UA =250 (*)    Spec Grav, UA >=1.030 (*)    Blood, UA trace-lysed (*)    Protein Ur, POC =100 (*)    All other components within normal limits  POCT FASTING CBG KUC MANUAL ENTRY - Abnormal; Notable for the following components:   POCT Glucose (KUC) 239 (*)    All other components within normal limits    Imaging: No results found.  Allergies  Allergen Reactions   Metformin  And Related Diarrhea    Past Medical History:  Diagnosis Date   Diabetes mellitus without complication (HCC)    Hyperlipidemia    Hypertension    Social History   Socioeconomic History   Marital status: Married    Spouse name: Not on file   Number of children: 3   Years of education: Not on file   Highest education level: Some college, no degree  Occupational History   Occupation: Education officer, environmental  Tobacco Use   Smoking status: Never   Smokeless tobacco: Never  Vaping Use   Vaping status: Never Used  Substance and Sexual Activity   Alcohol use: No    Comment: 1-2 a month   Drug use: No   Sexual activity: Yes    Birth control/protection: None  Other  Topics Concern   Not on file  Social History Narrative   Not on file   Social Drivers of Health   Financial Resource Strain: Low Risk  (07/22/2023)   Overall Financial Resource Strain (CARDIA)    Difficulty of Paying Living Expenses: Not hard at all  Food Insecurity: No Food Insecurity (07/22/2023)   Hunger Vital Sign    Worried About Running Out of Food in the Last Year: Never true    Ran Out of Food in the Last Year: Never true  Transportation Needs: No Transportation Needs (07/22/2023)   PRAPARE - Administrator, Civil Service (Medical): No    Lack of Transportation (Non-Medical): No  Physical Activity: Unknown (07/22/2023)   Exercise  Vital Sign    Days of Exercise per Week: 0 days    Minutes of Exercise per Session: Not on file  Stress: No Stress Concern Present (07/22/2023)   [employer] of Occupational Health - Occupational Stress Questionnaire    Feeling of Stress : Not at all  Social Connections: Unknown (07/22/2023)   Social Connection and Isolation Panel    Frequency of Communication with Friends and Family: Once a week    Frequency of Social Gatherings with Friends and Family: Patient declined    Attends Religious Services: More than 4 times per year    Active Member of [employer] or Organizations: No    Attends Engineer, structural: Not on file    Marital Status: Married  Catering manager Violence: Not on file   Family History  Problem Relation Age of Onset   Hypertension Mother    Diabetes Father    Heart disease Father    Kidney disease Father    Hodgkin's lymphoma Father        remission -2 years   Colon cancer Paternal Grandmother    Cancer Neg Hx    Early death Neg Hx    Stroke Neg Hx    Esophageal cancer Neg Hx    Rectal cancer Neg Hx    Stomach cancer Neg Hx    Past Surgical History:  Procedure Laterality Date   APPENDECTOMY  1992   ARTHROSCOPIC REPAIR ACL  1990   TONSILECTOMY/ADENOIDECTOMY WITH MYRINGOTOMY        Rolinda Rogue, MD 03/18/24 [phone]

## 2024-03-18 NOTE — ED Triage Notes (Signed)
 Lower back pain for 2 weeks.  Pain raps around body and radiates down, anterior thighs bilaterally-not posterior.  Has had tylenol and ibuprofen

## 2024-03-18 NOTE — Discharge Instructions (Addendum)
 POCT FASTING CBG K[facility] MANUAL ENTRY - Abnormal; Notable for the following components:   POCT Glucose (KUC) 239 (*)    All other components within normal limits   Your blood pressure was noted to be elevated during your visit today. If you are currently taking medication for high blood pressure, please ensure you are taking this as directed. If you do not have a history of high blood pressure and your blood pressure remains persistently elevated, you may need to begin taking a medication at some point. You may return here within the next few days to recheck if unable to see your primary care provider or if you do not have a one.  BP (!) 161/100 (BP Location: Right Arm)   Pulse 88   Temp 98.5 F (36.9 C) (Oral)   Resp 18   SpO2 96%   BP Readings from Last 3 Encounters:  03/18/24 (!) 161/100  01/06/24 (!) 151/91  12/11/23 (!) 171/83

## 2024-03-19 ENCOUNTER — Ambulatory Visit (HOSPITAL_COMMUNITY)

## 2024-07-28 ENCOUNTER — Telehealth: Payer: Self-pay | Admitting: Gastroenterology

## 2024-07-28 NOTE — Telephone Encounter (Signed)
 Good morning Dr. Stacia   The following patient was scheduled for an EGD in May and wants to know if he can reschedule it or does he need to come in for an office visit to see you to further discuss. Please review and advise of scheduling. Thank you.

## 2024-08-10 NOTE — Telephone Encounter (Signed)
 Spoke with PT he is on the road and not ready to schedule as of now.
